# Patient Record
Sex: Female | Born: 1945 | Race: White | Hispanic: No | Marital: Married | Smoking: Never smoker
Health system: Southern US, Community
[De-identification: ages and names within clinical notes are randomized; demographics above are authoritative.]

## PROBLEM LIST (undated history)

## (undated) DIAGNOSIS — Z9221 Personal history of antineoplastic chemotherapy: Secondary | ICD-10-CM

## (undated) DIAGNOSIS — K5900 Constipation, unspecified: Secondary | ICD-10-CM

## (undated) DIAGNOSIS — H9319 Tinnitus, unspecified ear: Secondary | ICD-10-CM

## (undated) DIAGNOSIS — C801 Malignant (primary) neoplasm, unspecified: Secondary | ICD-10-CM

## (undated) DIAGNOSIS — H409 Unspecified glaucoma: Secondary | ICD-10-CM

## (undated) DIAGNOSIS — I700 Atherosclerosis of aorta: Secondary | ICD-10-CM

## (undated) DIAGNOSIS — N3289 Other specified disorders of bladder: Secondary | ICD-10-CM

## (undated) DIAGNOSIS — K219 Gastro-esophageal reflux disease without esophagitis: Secondary | ICD-10-CM

## (undated) DIAGNOSIS — R519 Headache, unspecified: Secondary | ICD-10-CM

## (undated) DIAGNOSIS — G629 Polyneuropathy, unspecified: Secondary | ICD-10-CM

## (undated) DIAGNOSIS — R030 Elevated blood-pressure reading, without diagnosis of hypertension: Secondary | ICD-10-CM

## (undated) DIAGNOSIS — G8929 Other chronic pain: Secondary | ICD-10-CM

## (undated) DIAGNOSIS — H269 Unspecified cataract: Secondary | ICD-10-CM

## (undated) HISTORY — DX: Gastro-esophageal reflux disease without esophagitis: K21.9

## (undated) HISTORY — DX: Tinnitus, unspecified ear: H93.19

## (undated) HISTORY — DX: Unspecified glaucoma: H40.9

## (undated) HISTORY — DX: Other chronic pain: G89.29

## (undated) HISTORY — PX: OTHER SURGICAL HISTORY: SHX169

## (undated) HISTORY — DX: Polyneuropathy, unspecified: G62.9

## (undated) HISTORY — DX: Headache, unspecified: R51.9

## (undated) HISTORY — DX: Unspecified cataract: H26.9

## (undated) HISTORY — PX: COLON SURGERY: SHX602

---

## 2006-08-31 ENCOUNTER — Ambulatory Visit: Payer: Self-pay | Admitting: Internal Medicine

## 2008-11-23 ENCOUNTER — Ambulatory Visit: Payer: Self-pay | Admitting: Internal Medicine

## 2011-01-22 ENCOUNTER — Ambulatory Visit: Payer: Self-pay | Admitting: Internal Medicine

## 2012-11-24 ENCOUNTER — Ambulatory Visit: Payer: Self-pay | Admitting: Internal Medicine

## 2012-12-01 ENCOUNTER — Ambulatory Visit: Payer: Self-pay | Admitting: Internal Medicine

## 2014-08-08 ENCOUNTER — Other Ambulatory Visit: Payer: Self-pay | Admitting: Internal Medicine

## 2014-08-08 ENCOUNTER — Ambulatory Visit
Admission: RE | Admit: 2014-08-08 | Discharge: 2014-08-08 | Disposition: A | Discharge status: Home / Self Care | Payer: Medicare Other | Source: Ambulatory Visit | Attending: Internal Medicine | Admitting: Internal Medicine

## 2014-08-08 DIAGNOSIS — R112 Nausea with vomiting, unspecified: Secondary | ICD-10-CM | POA: Insufficient documentation

## 2014-08-08 DIAGNOSIS — R109 Unspecified abdominal pain: Secondary | ICD-10-CM | POA: Diagnosis present

## 2014-08-08 DIAGNOSIS — R102 Pelvic and perineal pain: Secondary | ICD-10-CM

## 2014-08-09 ENCOUNTER — Ambulatory Visit
Admission: RE | Admit: 2014-08-09 | Discharge: 2014-08-09 | Disposition: A | Discharge status: Home / Self Care | Payer: Medicare Other | Source: Ambulatory Visit | Attending: Internal Medicine | Admitting: Internal Medicine

## 2014-08-09 DIAGNOSIS — R102 Pelvic and perineal pain: Secondary | ICD-10-CM | POA: Insufficient documentation

## 2014-08-15 DIAGNOSIS — C189 Malignant neoplasm of colon, unspecified: Secondary | ICD-10-CM

## 2014-08-15 HISTORY — DX: Malignant neoplasm of colon, unspecified: C18.9

## 2014-08-21 ENCOUNTER — Ambulatory Visit (INDEPENDENT_AMBULATORY_CARE_PROVIDER_SITE_OTHER): Payer: Medicare Other | Admitting: Gastroenterology

## 2014-08-21 ENCOUNTER — Encounter: Payer: Self-pay | Admitting: Gastroenterology

## 2014-08-21 VITALS — BP 158/69 | HR 94 | Temp 98.1°F | Ht 62.0 in | Wt 118.0 lb

## 2014-08-21 DIAGNOSIS — K921 Melena: Secondary | ICD-10-CM | POA: Insufficient documentation

## 2014-08-21 DIAGNOSIS — K5900 Constipation, unspecified: Secondary | ICD-10-CM | POA: Insufficient documentation

## 2014-08-21 DIAGNOSIS — R112 Nausea with vomiting, unspecified: Secondary | ICD-10-CM | POA: Insufficient documentation

## 2014-08-21 DIAGNOSIS — R634 Abnormal weight loss: Secondary | ICD-10-CM | POA: Insufficient documentation

## 2014-08-21 DIAGNOSIS — R194 Change in bowel habit: Secondary | ICD-10-CM | POA: Insufficient documentation

## 2014-08-21 NOTE — Progress Notes (Signed)
Gastroenterology Consultation  Referring Provider:     Albina Billet, MD Primary Care Physician:  Albina Billet, MD Primary Gastroenterologist:  Dr. Allen Norris     Reason for Consultation:    Change in bowel habits        HPI:   Stacey Miranda is a 69 y.o. y/o female referred for consultation & management of diarrhea by Dr. Albina Billet, MD.  As patient comes to me for a history of diarrhea with weight loss the last 2 weeks. The patient reports that she has been taking waxes because she has chronic constipation. The patient also reports that she has had chronic constipation her whole life until 2 weeks ago. The patient also reports that she has a sister with colon polyps and she had a colonoscopy in 2006 and was recommended to have another colonoscopy in 5 years but did not have one. The patient now reports that she took laxity and on 2 episodes had nausea and vomiting associated with the lactulose. She also states that she has had blood in her stools. She had reports that the 20 pound weight loss was not intentional and she states that she has not been eating recently. The patient also reports that she drinks milk on a regular basis. The patient also states that she is an avid eater of fruits and vegetables because of her constipation.  History reviewed. No pertinent past medical history.  History reviewed. No pertinent past surgical history.  Prior to Admission medications   Not on File    Family History  Problem Relation Age of Onset  . Hodgkin's lymphoma Mother   . COPD Father   . Fibroids Sister      History  Substance Use Topics  . Smoking status: Never Smoker   . Smokeless tobacco: Not on file  . Alcohol Use: No    Allergies as of 08/21/2014  . (No Known Allergies)    Review of Systems:    All systems reviewed and negative except where noted in HPI.   Physical Exam:  BP 158/69 mmHg  Pulse 94  Temp(Src) 98.1 F (36.7 C) (Oral)  Ht 5\' 2"  (1.575 m)  Wt 118 lb (53.524 kg)   BMI 21.58 kg/m2 No LMP recorded. Patient is postmenopausal. Psych:  Alert and cooperative. Normal mood and affect. General:   Alert,  Well-developed, well-nourished, pleasant and cooperative in NAD Head:  Normocephalic and atraumatic. Eyes:  Sclera clear, no icterus.   Conjunctiva pink. Ears:  Normal auditory acuity. Nose:  No deformity, discharge, or lesions. Mouth:  No deformity or lesions,oropharynx pink & moist. Neck:  Supple; no masses or thyromegaly. Lungs:  Respirations even and unlabored.  Clear throughout to auscultation.   No wheezes, crackles, or rhonchi. No acute distress. Heart:  Regular rate and rhythm; no murmurs, clicks, rubs, or gallops. Abdomen:  Normal bowel sounds.  No bruits.  Soft, non-tender and non-distended without masses, hepatosplenomegaly or hernias noted.  No guarding or rebound tenderness.  Negative Carnett sign.   Rectal:  Deferred.  Msk:  Symmetrical without gross deformities.  Good, equal movement & strength bilaterally. Pulses:  Normal pulses noted. Extremities:  No clubbing or edema.  No cyanosis. Neurologic:  Alert and oriented x3;  grossly normal neurologically. Skin:  Intact without significant lesions or rashes.  No jaundice. Lymph Nodes:  No significant cervical adenopathy. Psych:  Alert and cooperative. Normal mood and affect.  Imaging Studies: US Transvaginal Non-ob  09/08/14   CLINICAL DATA:  Pelvic pain.  EXAM: TRANSABDOMINAL AND TRANSVAGINAL ULTRASOUND OF PELVIS  TECHNIQUE: Both transabdominal and transvaginal ultrasound examinations of the pelvis were performed. Transabdominal technique was performed for global imaging of the pelvis including uterus, ovaries, adnexal regions, and pelvic cul-de-sac. It was necessary to proceed with endovaginal exam following the transabdominal exam to visualize the endometrium and ovaries.  COMPARISON:  None  FINDINGS: Uterus  Measurements: 4.3 x 2 x 2.7 cm. No fibroids or other mass visualized.  Endometrium   Thickness: 1.8 mm. Trace amount of fluid within the endometrium. No focal abnormality visualized.  Right ovary  Measurements: 2.4 x 0.8 x 0.9 cm. Normal appearance/no adnexal mass.  Left ovary  Measurements: 2.2 x 0.7 x 0.9 cm. Normal appearance/no adnexal mass.  Other findings  Trace pelvic free fluid likely physiologic.  IMPRESSION: Normal pelvic ultrasound.  No pelvic soft tissue mass.   Electronically Signed   By: Kathreen Devoid   On: 08/09/2014 16:46   US Pelvis Complete  08/09/2014   CLINICAL DATA:  Pelvic pain.  EXAM: TRANSABDOMINAL AND TRANSVAGINAL ULTRASOUND OF PELVIS  TECHNIQUE: Both transabdominal and transvaginal ultrasound examinations of the pelvis were performed. Transabdominal technique was performed for global imaging of the pelvis including uterus, ovaries, adnexal regions, and pelvic cul-de-sac. It was necessary to proceed with endovaginal exam following the transabdominal exam to visualize the endometrium and ovaries.  COMPARISON:  None  FINDINGS: Uterus  Measurements: 4.3 x 2 x 2.7 cm. No fibroids or other mass visualized.  Endometrium  Thickness: 1.8 mm. Trace amount of fluid within the endometrium. No focal abnormality visualized.  Right ovary  Measurements: 2.4 x 0.8 x 0.9 cm. Normal appearance/no adnexal mass.  Left ovary  Measurements: 2.2 x 0.7 x 0.9 cm. Normal appearance/no adnexal mass.  Other findings  Trace pelvic free fluid likely physiologic.  IMPRESSION: Normal pelvic ultrasound.  No pelvic soft tissue mass.   Electronically Signed   By: Kathreen Devoid   On: 08/09/2014 16:46   Dg Abd Acute W/chest  08/08/2014   CLINICAL DATA:  Abdominal pain for 4 days, cramping, nausea and vomiting  EXAM: DG ABDOMEN ACUTE W/ 1V CHEST  COMPARISON:  None.  FINDINGS: A linear opacity adjacent to the left heart border at the left lung base probably represents scarring. Comparison with any prior chest x-ray would be helpful. No infiltrate or effusion is seen. Mediastinal and hilar contours are  unremarkable. The heart is within normal limits in size.  Supine and erect views the abdomen show both large and small bowel gas to be present with no obstruction. No free air is seen. There is and air-fluid level in the right colon which can be seen with enemas and or diarrhea. No opaque calculi are noted. There is soft tissue fullness in the mid right pelvis on the supine view which may be due to a prominent uterus, but a pelvic mass cannot be excluded and clinical correlation is recommended. Ultrasound or CT would be helpful if further assessment is warranted.  IMPRESSION: 1. No active lung disease. Probable linear scar at the left lung base. 2. No bowel obstruction or free air. Air-fluid level in the right colon most likely due to diarrhea or enemas. 3. Soft tissue fullness of the right pelvis. Possible enlarged uterus but correlate clinically.   Electronically Signed   By: Ivar Drape M.D.   On: 08/08/2014 09:52

## 2014-08-21 NOTE — Assessment & Plan Note (Signed)
This patient is a 69 year old woman who comes in today for a change in bowel habits. The patient's husband has been a patient of mine for some time. The patient now reports that after being constipated her whole life she is now having diarrhea for the last 2 weeks with a weight loss of 20 pounds. She was due for a colonoscopy 5 years ago but did not have it done because of a family history of colon polyps. The patient states that her colonoscopy did not show any colon polyps. The patient also has had nausea because of the laxatives was taking. The patient told to stop her magnesium and laxatives due to her diarrhea. The patient had a KUB that showed her to have fluid in the colon consistent with diarrhea. The patient also had a pelvic ultrasound that showed an enlarged uterus. The patient will be set up for a colonoscopy due to her diarrhea, and bowel habits and weight loss. The patient  has been explained the plan and agrees with it.

## 2014-08-24 ENCOUNTER — Ambulatory Visit (INDEPENDENT_AMBULATORY_CARE_PROVIDER_SITE_OTHER)
Admission: EM | Admit: 2014-08-24 | Discharge: 2014-08-24 | Disposition: A | Discharge status: Home / Self Care | Payer: Medicare Other | Source: Home / Self Care | Attending: Family Medicine | Admitting: Family Medicine

## 2014-08-24 ENCOUNTER — Encounter: Payer: Self-pay | Admitting: Emergency Medicine

## 2014-08-24 ENCOUNTER — Inpatient Hospital Stay
Admission: EM | Admit: 2014-08-24 | Discharge: 2014-08-29 | DRG: 330 | Disposition: A | Discharge status: Home / Self Care | Payer: Medicare Other | Attending: Surgery | Admitting: Surgery

## 2014-08-24 ENCOUNTER — Emergency Department: Payer: Medicare Other

## 2014-08-24 ENCOUNTER — Telehealth: Payer: Self-pay | Admitting: Gastroenterology

## 2014-08-24 DIAGNOSIS — K5900 Constipation, unspecified: Secondary | ICD-10-CM

## 2014-08-24 DIAGNOSIS — K593 Megacolon, not elsewhere classified: Secondary | ICD-10-CM | POA: Diagnosis present

## 2014-08-24 DIAGNOSIS — R140 Abdominal distension (gaseous): Secondary | ICD-10-CM

## 2014-08-24 DIAGNOSIS — Z825 Family history of asthma and other chronic lower respiratory diseases: Secondary | ICD-10-CM

## 2014-08-24 DIAGNOSIS — K6389 Other specified diseases of intestine: Secondary | ICD-10-CM | POA: Diagnosis present

## 2014-08-24 DIAGNOSIS — K56600 Partial intestinal obstruction, unspecified as to cause: Secondary | ICD-10-CM

## 2014-08-24 DIAGNOSIS — Z791 Long term (current) use of non-steroidal anti-inflammatories (NSAID): Secondary | ICD-10-CM | POA: Diagnosis not present

## 2014-08-24 DIAGNOSIS — Z807 Family history of other malignant neoplasms of lymphoid, hematopoietic and related tissues: Secondary | ICD-10-CM | POA: Diagnosis not present

## 2014-08-24 DIAGNOSIS — E440 Moderate protein-calorie malnutrition: Secondary | ICD-10-CM | POA: Diagnosis not present

## 2014-08-24 DIAGNOSIS — R1900 Intra-abdominal and pelvic swelling, mass and lump, unspecified site: Secondary | ICD-10-CM | POA: Diagnosis present

## 2014-08-24 HISTORY — DX: Constipation, unspecified: K59.00

## 2014-08-24 LAB — CBC WITH DIFFERENTIAL/PLATELET
BASOS ABS: 0.3 10*3/uL — AB (ref 0–0.1)
BASOS PCT: 3 %
EOS ABS: 0.1 10*3/uL (ref 0–0.7)
EOS PCT: 1 %
HEMATOCRIT: 43.6 % (ref 35.0–47.0)
Hemoglobin: 14.3 g/dL (ref 12.0–16.0)
Lymphocytes Relative: 15 %
Lymphs Abs: 1.6 10*3/uL (ref 1.0–3.6)
MCH: 30.4 pg (ref 26.0–34.0)
MCHC: 32.8 g/dL (ref 32.0–36.0)
MCV: 92.7 fL (ref 80.0–100.0)
MONOS PCT: 7 %
Monocytes Absolute: 0.7 10*3/uL (ref 0.2–0.9)
Neutro Abs: 8.2 10*3/uL — ABNORMAL HIGH (ref 1.4–6.5)
Neutrophils Relative %: 74 %
PLATELETS: 416 10*3/uL (ref 150–440)
RBC: 4.7 MIL/uL (ref 3.80–5.20)
RDW: 15.3 % — ABNORMAL HIGH (ref 11.5–14.5)
WBC: 10.9 10*3/uL (ref 3.6–11.0)

## 2014-08-24 LAB — COMPREHENSIVE METABOLIC PANEL
ALBUMIN: 5 g/dL (ref 3.5–5.0)
ALK PHOS: 62 U/L (ref 38–126)
ALT: 13 U/L — ABNORMAL LOW (ref 14–54)
ANION GAP: 15 (ref 5–15)
AST: 27 U/L (ref 15–41)
BILIRUBIN TOTAL: 0.9 mg/dL (ref 0.3–1.2)
BUN: 10 mg/dL (ref 6–20)
CO2: 24 mmol/L (ref 22–32)
Calcium: 9.7 mg/dL (ref 8.9–10.3)
Chloride: 101 mmol/L (ref 101–111)
Creatinine, Ser: 0.73 mg/dL (ref 0.44–1.00)
GFR calc Af Amer: >60 mL/min (ref >60)
GFR calc non Af Amer: >60 mL/min (ref >60)
Glucose, Bld: 120 mg/dL — ABNORMAL HIGH (ref 65–99)
POTASSIUM: 3.1 mmol/L — AB (ref 3.5–5.1)
SODIUM: 140 mmol/L (ref 135–145)
TOTAL PROTEIN: 7.9 g/dL (ref 6.5–8.1)

## 2014-08-24 LAB — LIPASE, BLOOD: LIPASE: 35 U/L (ref 22–51)

## 2014-08-24 MED ORDER — MORPHINE SULFATE 2 MG/ML IJ SOLN
2.0000 mg | INTRAMUSCULAR | Status: DC | PRN
  Administered 2014-08-25 (×2): 2 mg via INTRAVENOUS
  Filled 2014-08-24 (×2): qty 1

## 2014-08-24 MED ORDER — MORPHINE SULFATE 4 MG/ML IJ SOLN
4.0000 mg | Freq: Once | INTRAMUSCULAR | Status: AC
  Administered 2014-08-24: 4 mg via INTRAVENOUS

## 2014-08-24 MED ORDER — POTASSIUM CHLORIDE 10 MEQ/100ML IV SOLN
10.0000 meq | INTRAVENOUS | Status: AC
  Administered 2014-08-25 (×2): 10 meq via INTRAVENOUS
  Filled 2014-08-24 (×2): qty 100

## 2014-08-24 MED ORDER — IOHEXOL 240 MG/ML SOLN
50.0000 mL | Freq: Once | INTRAMUSCULAR | Status: AC | PRN
  Administered 2014-08-24: 50 mL via ORAL

## 2014-08-24 MED ORDER — ONDANSETRON HCL 4 MG/2ML IJ SOLN
4.0000 mg | INTRAMUSCULAR | Status: AC
  Administered 2014-08-24: 4 mg via INTRAVENOUS

## 2014-08-24 MED ORDER — ONDANSETRON HCL 4 MG/2ML IJ SOLN
INTRAMUSCULAR | Status: AC
Start: 2014-08-24 — End: 2014-08-25
  Filled 2014-08-24: qty 2

## 2014-08-24 MED ORDER — PANTOPRAZOLE SODIUM 40 MG IV SOLR
INTRAVENOUS | Status: AC
  Administered 2014-08-24: 40 mg via INTRAVENOUS
  Filled 2014-08-24: qty 40

## 2014-08-24 MED ORDER — PANTOPRAZOLE SODIUM 40 MG IV SOLR
40.0000 mg | Freq: Every day | INTRAVENOUS | Status: DC
  Administered 2014-08-24 – 2014-08-28 (×5): 40 mg via INTRAVENOUS
  Filled 2014-08-24 (×4): qty 40

## 2014-08-24 MED ORDER — HEPARIN SODIUM (PORCINE) 5000 UNIT/ML IJ SOLN
5000.0000 [IU] | Freq: Three times a day (TID) | INTRAMUSCULAR | Status: DC
  Administered 2014-08-24 – 2014-08-29 (×13): 5000 [IU] via SUBCUTANEOUS
  Filled 2014-08-24 (×12): qty 1

## 2014-08-24 MED ORDER — HEPARIN SODIUM (PORCINE) 5000 UNIT/ML IJ SOLN
INTRAMUSCULAR | Status: AC
  Administered 2014-08-24: 5000 [IU] via SUBCUTANEOUS
  Filled 2014-08-24: qty 1

## 2014-08-24 MED ORDER — IOHEXOL 300 MG/ML  SOLN
100.0000 mL | Freq: Once | INTRAMUSCULAR | Status: AC | PRN
  Administered 2014-08-24: 100 mL via INTRAVENOUS

## 2014-08-24 MED ORDER — MORPHINE SULFATE 2 MG/ML IJ SOLN
INTRAMUSCULAR | Status: AC
  Administered 2014-08-24: 4 mg via INTRAVENOUS
  Filled 2014-08-24: qty 2

## 2014-08-24 MED ORDER — ONDANSETRON HCL 4 MG/2ML IJ SOLN: 4.0000 mg | Freq: Four times a day (QID) | INTRAMUSCULAR | Status: DC | PRN

## 2014-08-24 NOTE — Telephone Encounter (Signed)
Call back constipation

## 2014-08-24 NOTE — ED Provider Notes (Signed)
CSN: [id number]     Arrival date & time 08/24/14  1421 History   First MD Initiated Contact with Patient 08/24/14 1517     Chief Complaint  Patient presents with  . Constipation   (Consider location/radiation/quality/duration/timing/severity/associated sxs/prior Treatment) HPI Comments: 69 yo female with a  3 weeks h/o constipation, seen by PCP(several weeks ago; abdominal x-rays negative for obstruction, although air fluid level seen) and GI (this week) for this. Treatments have not worked. Patient complains of abdominal bloating, discomfort, nausea, fatigue. Denies fevers, chills. States was scheduled for a colonoscopy next week but states has been feeling worse.   Patient is a 69 y.o. female presenting with constipation. The history is provided by the patient.  Constipation Severity:  Severe Time since last bowel movement:  3 weeks Timing:  Constant Progression:  Worsening Chronicity:  New Stool description:  None produced Associated symptoms: nausea and vomiting     Past Medical History  Diagnosis Date  . Constipation    History reviewed. No pertinent past surgical history. Family History  Problem Relation Age of Onset  . Hodgkin's lymphoma Mother   . COPD Father   . Fibroids Sister    History  Substance Use Topics  . Smoking status: Never Smoker   . Smokeless tobacco: Not on file  . Alcohol Use: No   OB History    No data available     Review of Systems  Gastrointestinal: Positive for nausea, vomiting and constipation.    Allergies  Review of patient's allergies indicates no known allergies.  Home Medications   Prior to Admission medications   Not on File   BP 148/96 mmHg  Pulse 100  Temp(Src) 98.6 F (37 C) (Oral)  Ht 5\' 2"  (1.575 m)  Wt 116 lb (52.617 kg)  BMI 21.21 kg/m2  SpO2 100% Physical Exam  Constitutional: She appears well-developed and well-nourished. No distress.  Foul smelling breath  Cardiovascular: Normal rate and normal heart  sounds.   Pulmonary/Chest: Effort normal. No respiratory distress.  Abdominal: Soft. Bowel sounds are normal. She exhibits distension. She exhibits no mass. There is tenderness (mild diffuse tenderness). There is no rebound and no guarding.  Skin: No rash noted. She is not diaphoretic.  Nursing note and vitals reviewed.   ED Course  Procedures (including critical care time) Labs Review Labs Reviewed - No data to display  Imaging Review No results found.   MDM   1. Constipation, unspecified constipation type   2. Abdominal bloating    Plan: 1. Diagnosis reviewed with patient; discussed with patient due to no improvement with treatments from PCP and GI as well as air fluid level seen in x-ray from 08/08/14, and worsening symptoms, I would recommend patient go to ED for further evaluation and management. Patient verbalizes understanding and will proceed with husband by private vehicle to ED in stable condition.   Norval Gable, MD 08/24/14 [phone]

## 2014-08-24 NOTE — ED Notes (Signed)
Sent from [facility], seen PCP and GI (Dr. Durwin Reges) on Tuesday.  She has had three weeks of constipation unrelieved by laxatives.  She took citacel as instructed and bottle of Mg Citrate at 1230, did self rectal exam, no stool felt.  No BM since Monday and it was small, chronic constipation since 8s.  States she has had 2 episodes of vomiting undigested food over the past 2 weeks.  No fever or chills.  1 episode of rectal bleeding after milk of magnesia, on 08/13/14 but it has resolved.

## 2014-08-24 NOTE — ED Provider Notes (Signed)
[facility] Emergency Department Provider Note  ____________________________________________  Time seen: Approximately 8:42 PM  I have reviewed the triage vital signs and the nursing notes.   HISTORY  Chief Complaint Constipation; Nausea; and Emesis    HPI Stacey Miranda is a 69 y.o. female with a history of chronic constipation who presents with 3 weeks of worsening constipation.  She states that she has not had a solid bowel movement for 3 weeks but has had some loose stool.  She takes milk of magnesia regularly.  She has seen her primary care doctor as well as her GI doctor (Dr. Allen Norris).  She is been feeling increasingly bloated and severe pressure in her lower abdomen.  She denies fever and chills.  She has had intermittent nausea and vomiting.  She has a colonoscopy scheduled for next week but she has been feeling worse.  Symptoms are constant but waxing and waning in severity.  Past Medical History  Diagnosis Date  . Constipation     Patient Active Problem List   Diagnosis Date Noted  . Blood in stool 08/21/2014  . Constipation 08/21/2014  . Nausea with vomiting 08/21/2014  . Change in bowel habits 08/21/2014  . Loss of weight 08/21/2014    History reviewed. No pertinent past surgical history.  No current outpatient prescriptions on file.  Allergies Review of patient's allergies indicates no known allergies.  Family History  Problem Relation Age of Onset  . Hodgkin's lymphoma Mother   . COPD Father   . Fibroids Sister     Social History History  Substance Use Topics  . Smoking status: Never Smoker   . Smokeless tobacco: Not on file  . Alcohol Use: No    Review of Systems Constitutional: No fever/chills Eyes: No visual changes. ENT: No sore throat. Cardiovascular: Denies chest pain. Respiratory: Denies shortness of breath. Gastrointestinal: Abdominal "fullness" that is severe at times..  Nausea and vomiting.  No diarrhea.  Acute on  chronic constipation Genitourinary: Negative for dysuria. Musculoskeletal: Negative for back pain. Skin: Negative for rash. Neurological: Negative for headaches, focal weakness or numbness.  10-point ROS otherwise negative.  ____________________________________________   PHYSICAL EXAM:  VITAL SIGNS: ED Triage Vitals  Enc Vitals Group     BP 08/24/14 1605 159/100 mmHg     Pulse Rate 08/24/14 1605 111     Resp 08/24/14 1605 20     Temp 08/24/14 1605 98.4 F (36.9 C)     Temp Source 08/24/14 1605 Oral     SpO2 08/24/14 1605 100 %     Weight 08/24/14 1605 116 lb (52.617 kg)     Height 08/24/14 1605 5\' 2"  (1.575 m)     Head Cir --      Peak Flow --      Pain Score 08/24/14 1607 2     Pain Loc --      Pain Edu? --      Excl. in [place]? --     Constitutional: Alert and oriented.  Appears very uncomfortable and is holding her abdomen. Eyes: Conjunctivae are normal. PERRL. EOMI. Head: Atraumatic. Nose: No congestion/rhinnorhea. Mouth/Throat: Mucous membranes are moist.  Oropharynx non-erythematous. Neck: No stridor.   Cardiovascular: Normal rate, regular rhythm. Grossly normal heart sounds.  Good peripheral circulation. Respiratory: Normal respiratory effort.  No retractions. Lungs CTAB. Gastrointestinal: Soft, mild distention most pronounced on the right side of abdomen with moderate tenderness to palpation of the right side of her abdomen without specific focality.Marland Kitchen No  abdominal bruits. No CVA tenderness. Musculoskeletal: No lower extremity tenderness nor edema.  No joint effusions. Neurologic:  Normal speech and language. No gross focal neurologic deficits are appreciated. Speech is normal. No gait instability. Skin:  Skin is warm, dry and intact. No rash noted. Psychiatric: Mood and affect are normal. Speech and behavior are normal.  ____________________________________________   LABS (all labs ordered are listed, but only abnormal results are displayed)  Labs Reviewed   CBC WITH DIFFERENTIAL/PLATELET - Abnormal; Notable for the following:    RDW 15.3 (*)    Neutro Abs 8.2 (*)    Basophils Absolute 0.3 (*)    All other components within normal limits  COMPREHENSIVE METABOLIC PANEL - Abnormal; Notable for the following:    Potassium 3.1 (*)    Glucose, Bld 120 (*)    ALT 13 (*)    All other components within normal limits  LIPASE, BLOOD  URINALYSIS COMPLETEWITH MICROSCOPIC ([facility] ONLY)   ____________________________________________  EKG  Not indicated ____________________________________________  RADIOLOGY    PRIOR STUDIES  US Transvaginal Non-ob  2014-09-08   CLINICAL DATA:  Pelvic pain.  EXAM: TRANSABDOMINAL AND TRANSVAGINAL ULTRASOUND OF PELVIS  TECHNIQUE: Both transabdominal and transvaginal ultrasound examinations of the pelvis were performed. Transabdominal technique was performed for global imaging of the pelvis including uterus, ovaries, adnexal regions, and pelvic cul-de-sac. It was necessary to proceed with endovaginal exam following the transabdominal exam to visualize the endometrium and ovaries.  COMPARISON:  None  FINDINGS: Uterus  Measurements: 4.3 x 2 x 2.7 cm. No fibroids or other mass visualized.  Endometrium  Thickness: 1.8 mm. Trace amount of fluid within the endometrium. No focal abnormality visualized.  Right ovary  Measurements: 2.4 x 0.8 x 0.9 cm. Normal appearance/no adnexal mass.  Left ovary  Measurements: 2.2 x 0.7 x 0.9 cm. Normal appearance/no adnexal mass.  Other findings  Trace pelvic free fluid likely physiologic.  IMPRESSION: Normal pelvic ultrasound.  No pelvic soft tissue mass.   Electronically Signed   By: Kathreen Devoid   On: 09/08/2014 16:46   US Pelvis Complete  09-08-2014   CLINICAL DATA:  Pelvic pain.  EXAM: TRANSABDOMINAL AND TRANSVAGINAL ULTRASOUND OF PELVIS  TECHNIQUE: Both transabdominal and transvaginal ultrasound examinations of the pelvis were performed. Transabdominal technique was performed for global  imaging of the pelvis including uterus, ovaries, adnexal regions, and pelvic cul-de-sac. It was necessary to proceed with endovaginal exam following the transabdominal exam to visualize the endometrium and ovaries.  COMPARISON:  None  FINDINGS: Uterus  Measurements: 4.3 x 2 x 2.7 cm. No fibroids or other mass visualized.  Endometrium  Thickness: 1.8 mm. Trace amount of fluid within the endometrium. No focal abnormality visualized.  Right ovary  Measurements: 2.4 x 0.8 x 0.9 cm. Normal appearance/no adnexal mass.  Left ovary  Measurements: 2.2 x 0.7 x 0.9 cm. Normal appearance/no adnexal mass.  Other findings  Trace pelvic free fluid likely physiologic.  IMPRESSION: Normal pelvic ultrasound.  No pelvic soft tissue mass.   Electronically Signed   By: Kathreen Devoid   On: 09/08/2014 16:46   +++ NEW STUDY FROM THIS VISIT +++ Ct Abdomen Pelvis W Contrast  08/24/2014   CLINICAL DATA:  Subacute onset of constipation. Vomiting. Single episode of rectal bleeding. Initial encounter.  EXAM: CT ABDOMEN AND PELVIS WITH CONTRAST  TECHNIQUE: Multidetector CT imaging of the abdomen and pelvis was performed using the standard protocol following bolus administration of intravenous contrast.  CONTRAST:  182mL OMNIPAQUE IOHEXOL 300 MG/ML  SOLN  COMPARISON:  Abdominal radiograph performed 08/08/2014, and pelvic ultrasound performed 08/09/2014  FINDINGS: The visualized lung bases are clear.  The liver and spleen are unremarkable in appearance. The gallbladder is within normal limits. The pancreas and adrenal glands are unremarkable.  The kidneys are unremarkable in appearance. There is no evidence of hydronephrosis. No renal or ureteral stones are seen. No perinephric stranding is appreciated.  No free fluid is identified. The small bowel is unremarkable in appearance. The stomach is within normal limits. No acute vascular abnormalities are seen.  There is mild distention of the cecum to 9.4 cm, and dilatation of the ascending and  transverse colon to 7.7 cm in maximal diameter. This reflects partial obstruction due to a focal colonic mass just before the splenic flexure of the colon, measuring 3.1 x 2.0 cm. 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  A few small adjacent soft tissue nodules are seen, measuring up to 6 mm in short axis, likely reflecting adjacent nodes.  A small amount of air is noted within the colon distal to the mass, but the descending and sigmoid colon are otherwise decompressed.  The cecum is situated deep within the pelvis. The appendix is noted at the right hemipelvis, also filled with fluid, without evidence for appendicitis.  The bladder is displaced and compressed anteriorly by the distended cecum, but grossly unremarkable in appearance. The uterus is grossly unremarkable, though also compressed by the distended cecum. No suspicious adnexal masses are characterized. No inguinal lymphadenopathy is seen.  No acute osseous abnormalities are identified. There is vague nonspecific heterogeneity within the visualized osseous structures, without focal bony mass.  IMPRESSION: 1. Partial obstruction at the level of the splenic flexure of the colon due to a focal colonic mass, measuring 3.1 x 2.0 cm. Distention of the cecum to 9.4 cm, and dilatation of the ascending and transverse colon to 7.7 cm. 2. Few mildly prominent adjacent lymph nodes seen, measuring up to 6 mm in short axis, concerning for regional spread of disease. 3. The cecum is situated deep within the pelvis, and thus compresses the bladder and uterus, though the pelvis is otherwise unremarkable. 4. Vague nonspecific heterogeneity within the visualized osseous structures, without a focal bony mass.  These results were called by telephone at the time of interpretation on 08/24/2014 at 9:38 pm to Dr. Hinda Kehr, who verbally acknowledged these results.   Electronically Signed   By: Garald Balding M.D.   On: 08/24/2014 21:41  +++  Dg Abd  Acute W/chest  08/08/2014   CLINICAL DATA:  Abdominal pain for 4 days, cramping, nausea and vomiting  EXAM: DG ABDOMEN ACUTE W/ 1V CHEST  COMPARISON:  None.  FINDINGS: A linear opacity adjacent to the left heart border at the left lung base probably represents scarring. Comparison with any prior chest x-ray would be helpful. No infiltrate or effusion is seen. Mediastinal and hilar contours are unremarkable. The heart is within normal limits in size.  Supine and erect views the abdomen show both large and small bowel gas to be present with no obstruction. No free air is seen. There is and air-fluid level in the right colon which can be seen with enemas and or diarrhea. No opaque calculi are noted. There is soft tissue fullness in the mid right pelvis on the supine view which may be due to a prominent uterus, but a pelvic mass cannot be excluded and clinical correlation is recommended. Ultrasound or CT would be helpful if further  assessment is warranted.  IMPRESSION: 1. No active lung disease. Probable linear scar at the left lung base. 2. No bowel obstruction or free air. Air-fluid level in the right colon most likely due to diarrhea or enemas. 3. Soft tissue fullness of the right pelvis. Possible enlarged uterus but correlate clinically.   Electronically Signed   By: Ivar Drape M.D.   On: 08/08/2014 09:52    ____________________________________________   INITIAL IMPRESSION / ASSESSMENT AND PLAN / ED COURSE  Pertinent labs & imaging results that were available during my care of the patient were reviewed by me and considered in my medical decision making (see chart for details).  I suspect the patient's occasional loose stools may be loose stool leaking around a large impaction.  It is also possible that she has an SBO or ileus more acutely.  When I saw the patient she just had what she describes as her first solid bowel movement in several weeks which made her feel a little bit better but she still  appears quite uncomfortable.  We will evaluate with a CT abdomen and pelvis with contrast to evaluate for an emergent medical condition and that may proceed with other options such as an enema or manual inspection.   (Note that documentation was delayed due to multiple ED patients requiring immediate care.)   The patient has an abdominal mass with partial obstruction.  I consult to Dr. Rexene Edison who evaluated the patient in the emergency department and will admit for further management.  ____________________________________________  FINAL CLINICAL IMPRESSION(S) / ED DIAGNOSES  Final diagnoses:  Abdominal mass  Partial bowel obstruction      NEW MEDICATIONS STARTED DURING THIS VISIT:  New Prescriptions   No medications on file     Hinda Kehr, MD 08/25/14 [phone]

## 2014-08-24 NOTE — ED Notes (Signed)
Morphine 4mg  given via 2mg  vials.

## 2014-08-24 NOTE — ED Notes (Signed)
Patient reports some relief of nausea, completed first bottle of contrast without complication. Continues to report abdominal bloating and cramping however denies need for any medication for same.

## 2014-08-24 NOTE — ED Notes (Signed)
Pt states "I have had three weeks of constipation, I have seen PCP and GI (Dr. Durwin Reges) on Tuesday, I took Citacel as instruced and today I called him and he  instructed me to take a bottle of Mg Citrate, I did that at 12:30. I did a self rectal exam and do not feel stool. No BM since Monday and that was small. I have had chronic constipation since my 40's. I am throwing up undigested food."

## 2014-08-24 NOTE — ED Notes (Signed)
MD at bedside. 

## 2014-08-24 NOTE — H&P (Signed)
CC:  Constipation, N/V, poor appetite, 6 lb weight loss  HPI: Stacey Miranda is a pleasant, relatively healthy 69 yo F who presents with approx 2 weeks of worsening constipation, nausea/vomiting, anorexia and 6 lb weight loss.  Has a history of chronic constipation which she treats with milk of magnesia.  Over past 2 weeks has only had small, loose stools despite multiple laxatives.  + some recent nausea/vomiting and poor appetite.  Had seen Dr. Allen Norris recently with these symptoms and was planning on getting a colonoscopy on 6/16.  Came to ER on advice of office staff because of worsening inability to have BM.  No fevers/chills, night sweats, shortness of breath, cough, chest pain, dysuria/hematuria.  Active Ambulatory Problems    Diagnosis Date Noted  . Blood in stool 08/21/2014  . Constipation 08/21/2014  . Nausea with vomiting 08/21/2014  . Change in bowel habits 08/21/2014  . Loss of weight 08/21/2014   Resolved Ambulatory Problems    Diagnosis Date Noted  . No Resolved Ambulatory Problems   No Additional Past Medical History   History   Social History  . Marital Status: Married    Spouse Name: N/A  . Number of Children: N/A  . Years of Education: N/A   Occupational History  . Not on file.   Social History Main Topics  . Smoking status: Never Smoker   . Smokeless tobacco: Not on file  . Alcohol Use: No  . Drug Use: No  . Sexual Activity: Not on file   Other Topics Concern  . Not on file   Social History Narrative   No Known Allergies   Home Meds: Tylenol prn  ROS: Full ROS obtained, Pertinent positives and negatives as above.  Blood pressure 159/100, pulse 111, temperature 98.4 F (36.9 C), temperature source Oral, resp. rate 20, height 5\' 2"  (1.575 m), weight 52.617 kg (116 lb), SpO2 100 %. GEN: NAD/A&Ox3 FACE: no obvious facial trauma, normal external nose, normal external ears EYES: no scleral icterus, no conjunctivitis HEAD: normocephalic atraumatic CV: RRR, no  MRG RESP: moving air well, lungs clear ABD: soft, nontender, moderate distention EXT: moving all ext well, strength 5/5 NEURO: cnII-XII grossly intact, sensation intact all 4 ext  Labs: personally reviewed, significant for: K 3.1 WBC 10.9  CT Scan:  Personally reviewed Dilated Proximal colon with fluids with mass at splenic flexure  A/P 69 yo F admit with large bowel obstruction, concern for neoplasm.  Less likely ischemic stricture.  Will admit for IVF, GI consult for possible colonoscopy but likely will require surgery for obstructing mass.  Will leave final timing for daytime surgeon Dr. Leanora Cover.  I have discussed this with Stacey Miranda and she agrees to this plan.

## 2014-08-24 NOTE — ED Notes (Signed)
Patient transported to CT 

## 2014-08-25 ENCOUNTER — Inpatient Hospital Stay: Payer: Medicare Other | Admitting: Anesthesiology

## 2014-08-25 ENCOUNTER — Encounter
Admission: EM | Disposition: A | Discharge status: Home / Self Care | Payer: Self-pay | Source: Home / Self Care | Attending: Surgery

## 2014-08-25 ENCOUNTER — Encounter: Payer: Self-pay | Admitting: Anesthesiology

## 2014-08-25 DIAGNOSIS — E440 Moderate protein-calorie malnutrition: Secondary | ICD-10-CM | POA: Insufficient documentation

## 2014-08-25 DIAGNOSIS — K593 Megacolon, not elsewhere classified: Secondary | ICD-10-CM

## 2014-08-25 DIAGNOSIS — K5660 Unspecified intestinal obstruction: Secondary | ICD-10-CM

## 2014-08-25 HISTORY — PX: COLOSTOMY: SHX63

## 2014-08-25 LAB — CBC
HCT: 40.4 % (ref 35.0–47.0)
Hemoglobin: 13.1 g/dL (ref 12.0–16.0)
MCH: 29.7 pg (ref 26.0–34.0)
MCHC: 32.4 g/dL (ref 32.0–36.0)
MCV: 91.7 fL (ref 80.0–100.0)
Platelets: 396 10*3/uL (ref 150–440)
RBC: 4.4 MIL/uL (ref 3.80–5.20)
RDW: 15.3 % — ABNORMAL HIGH (ref 11.5–14.5)
WBC: 10.6 10*3/uL (ref 3.6–11.0)

## 2014-08-25 LAB — BASIC METABOLIC PANEL
Anion gap: 13 (ref 5–15)
BUN: 11 mg/dL (ref 6–20)
CHLORIDE: 101 mmol/L (ref 101–111)
CO2: 26 mmol/L (ref 22–32)
Calcium: 8.8 mg/dL — ABNORMAL LOW (ref 8.9–10.3)
Creatinine, Ser: 0.64 mg/dL (ref 0.44–1.00)
GFR calc Af Amer: >60 mL/min (ref >60)
GFR calc non Af Amer: >60 mL/min (ref >60)
Glucose, Bld: 116 mg/dL — ABNORMAL HIGH (ref 65–99)
Potassium: 3.3 mmol/L — ABNORMAL LOW (ref 3.5–5.1)
Sodium: 140 mmol/L (ref 135–145)

## 2014-08-25 SURGERY — CREATION, COLOSTOMY
Anesthesia: General | Site: Abdomen | Wound class: Clean Contaminated

## 2014-08-25 MED ORDER — KCL IN DEXTROSE-NACL 20-5-0.45 MEQ/L-%-% IV SOLN
INTRAVENOUS | Status: DC
  Administered 2014-08-25 – 2014-08-26 (×2): via INTRAVENOUS
  Administered 2014-08-26: 1000 mL via INTRAVENOUS
  Administered 2014-08-26 – 2014-08-28 (×5): via INTRAVENOUS
  Filled 2014-08-25 (×12): qty 1000

## 2014-08-25 MED ORDER — SODIUM CHLORIDE 0.9 % IV SOLN
1.5000 g | Freq: Four times a day (QID) | INTRAVENOUS | Status: DC
  Administered 2014-08-25: 1.5 g via INTRAVENOUS
  Filled 2014-08-25 (×4): qty 1.5

## 2014-08-25 MED ORDER — MIDAZOLAM HCL 2 MG/2ML IJ SOLN
INTRAMUSCULAR | Status: DC | PRN
  Administered 2014-08-25: 2 mg via INTRAVENOUS

## 2014-08-25 MED ORDER — ROCURONIUM BROMIDE 100 MG/10ML IV SOLN
INTRAVENOUS | Status: DC | PRN
  Administered 2014-08-25: 30 mg via INTRAVENOUS

## 2014-08-25 MED ORDER — FENTANYL CITRATE (PF) 100 MCG/2ML IJ SOLN
INTRAMUSCULAR | Status: DC | PRN
  Administered 2014-08-25 (×5): 50 ug via INTRAVENOUS

## 2014-08-25 MED ORDER — ONDANSETRON HCL 4 MG/2ML IJ SOLN: 4.0000 mg | Freq: Once | INTRAMUSCULAR | Status: DC | PRN

## 2014-08-25 MED ORDER — FENTANYL CITRATE (PF) 100 MCG/2ML IJ SOLN
INTRAMUSCULAR | Status: AC
  Filled 2014-08-25: qty 2

## 2014-08-25 MED ORDER — MORPHINE SULFATE 2 MG/ML IJ SOLN
2.0000 mg | INTRAMUSCULAR | Status: DC | PRN
  Administered 2014-08-25 (×3): 2 mg via INTRAVENOUS
  Administered 2014-08-26: 4 mg via INTRAVENOUS
  Administered 2014-08-26 (×2): 2 mg via INTRAVENOUS
  Administered 2014-08-27 (×2): 4 mg via INTRAVENOUS
  Administered 2014-08-28 (×2): 2 mg via INTRAVENOUS
  Filled 2014-08-25: qty 2
  Filled 2014-08-25 (×2): qty 1
  Filled 2014-08-25: qty 2
  Filled 2014-08-25 (×3): qty 1
  Filled 2014-08-25 (×2): qty 2
  Filled 2014-08-25: qty 1

## 2014-08-25 MED ORDER — PNEUMOCOCCAL VAC POLYVALENT 25 MCG/0.5ML IJ INJ: 0.5000 mL | INJECTION | INTRAMUSCULAR | Status: DC

## 2014-08-25 MED ORDER — FENTANYL CITRATE (PF) 100 MCG/2ML IJ SOLN
25.0000 ug | INTRAMUSCULAR | Status: DC | PRN
  Administered 2014-08-25 (×4): 25 ug via INTRAVENOUS

## 2014-08-25 MED ORDER — PROPOFOL 10 MG/ML IV BOLUS
INTRAVENOUS | Status: DC | PRN
  Administered 2014-08-25: 100 mg via INTRAVENOUS

## 2014-08-25 MED ORDER — SODIUM CHLORIDE 0.9 % IV BOLUS (SEPSIS)
1000.0000 mL | Freq: Once | INTRAVENOUS | Status: AC
  Administered 2014-08-25: 1000 mL via INTRAVENOUS

## 2014-08-25 MED ORDER — PNEUMOCOCCAL VAC POLYVALENT 25 MCG/0.5ML IJ INJ: 0.5000 mL | INJECTION | INTRAMUSCULAR | Status: DC | PRN

## 2014-08-25 MED ORDER — LACTATED RINGERS IV SOLN
INTRAVENOUS | Status: DC | PRN
  Administered 2014-08-25 (×2): via INTRAVENOUS

## 2014-08-25 MED ORDER — ONDANSETRON HCL 4 MG/2ML IJ SOLN
INTRAMUSCULAR | Status: DC | PRN
  Administered 2014-08-25: 4 mg via INTRAVENOUS

## 2014-08-25 MED ORDER — DEXAMETHASONE SODIUM PHOSPHATE 4 MG/ML IJ SOLN
INTRAMUSCULAR | Status: DC | PRN
  Administered 2014-08-25: 5 mg via INTRAVENOUS

## 2014-08-25 MED ORDER — PROMETHAZINE HCL 25 MG/ML IJ SOLN: 12.5000 mg | INTRAMUSCULAR | Status: DC | PRN

## 2014-08-25 MED ORDER — PROMETHAZINE HCL 25 MG PO TABS: 12.5000 mg | ORAL_TABLET | ORAL | Status: DC | PRN

## 2014-08-25 MED ORDER — SODIUM CHLORIDE 0.9 % IV SOLN
3.0000 g | INTRAVENOUS | Status: AC
  Administered 2014-08-25: 3 g via INTRAVENOUS
  Filled 2014-08-25: qty 3

## 2014-08-25 MED ORDER — SUCCINYLCHOLINE CHLORIDE 20 MG/ML IJ SOLN
INTRAMUSCULAR | Status: DC | PRN
  Administered 2014-08-25: 80 mg via INTRAVENOUS

## 2014-08-25 MED ORDER — LIDOCAINE HCL (CARDIAC) 20 MG/ML IV SOLN
INTRAVENOUS | Status: DC | PRN
  Administered 2014-08-25: 50 mg via INTRAVENOUS

## 2014-08-25 MED ORDER — PHENOL 1.4 % MT LIQD
1.0000 | OROMUCOSAL | Status: DC | PRN
  Filled 2014-08-25: qty 177

## 2014-08-25 SURGICAL SUPPLY — 32 items
CANISTER SUCT 1200ML W/VALVE (MISCELLANEOUS) ×3 IMPLANT
CATH TRAY 16F METER LATEX (MISCELLANEOUS) ×3 IMPLANT
CHLORAPREP W/TINT 26ML (MISCELLANEOUS) ×3 IMPLANT
CLOSURE WOUND 1/2 X4 (GAUZE/BANDAGES/DRESSINGS) ×1
DRAPE INCISE IOBAN 66X45 STRL (DRAPES) ×3 IMPLANT
DRAPE LAPAROTOMY 100X77 ABD (DRAPES) ×3 IMPLANT
DRSG OPSITE POSTOP 4X10 (GAUZE/BANDAGES/DRESSINGS) ×3 IMPLANT
ELECT CAUTERY BLADE 6.4 (BLADE) ×3 IMPLANT
ELECT CAUTERY NEEDLE TIP 1.0 (MISCELLANEOUS) ×3
ELECTRODE CAUTERY NEDL TIP 1.0 (MISCELLANEOUS) ×1 IMPLANT
GLOVE BIO SURGEON STRL SZ7.5 (GLOVE) ×3 IMPLANT
GLOVE INDICATOR 8.0 STRL GRN (GLOVE) ×3 IMPLANT
GOWN STRL REUS W/ TWL LRG LVL3 (GOWN DISPOSABLE) ×1 IMPLANT
GOWN STRL REUS W/TWL LRG LVL3 (GOWN DISPOSABLE) ×2
KIT RM TURNOVER STRD PROC AR (KITS) ×3 IMPLANT
NS IRRIG 1000ML POUR BTL (IV SOLUTION) ×3 IMPLANT
PACK BASIN MAJOR ARMC (MISCELLANEOUS) ×3 IMPLANT
PAD GROUND ADULT SPLIT (MISCELLANEOUS) ×3 IMPLANT
POUCH DRAIN 1 3/4 SMALL GREEN (OSTOMY) ×3 IMPLANT
RELOAD PROXIMATE 75MM BLUE (ENDOMECHANICALS) ×6 IMPLANT
SCALPEL HARMONIC ACE (MISCELLANEOUS) ×3 IMPLANT
SHEARS HARMONIC ACE PLUS 36CM (ENDOMECHANICALS) ×3 IMPLANT
SPONGE LAP 18X18 5 PK (GAUZE/BANDAGES/DRESSINGS) ×3 IMPLANT
STAPLER PROXIMATE 75MM BLUE (STAPLE) ×3 IMPLANT
STRIP CLOSURE SKIN 1/2X4 (GAUZE/BANDAGES/DRESSINGS) ×2 IMPLANT
SUT MNCRL 3 0 RB1 (SUTURE) ×2 IMPLANT
SUT MONOCRYL 3 0 RB1 (SUTURE) ×4
SUT PDS AB 1 TP1 54 (SUTURE) ×3 IMPLANT
SUT PROLENE 3 0 CT 1 (SUTURE) ×3 IMPLANT
SUT SILK 3-0 (SUTURE) ×3 IMPLANT
TUBE KAMVAC SUCTION (TUBING) ×3 IMPLANT
WAFER FLANGE 1 3/4 SMALL GREEN (OSTOMY) ×3 IMPLANT

## 2014-08-25 NOTE — Op Note (Signed)
Operative Note  Preoperative Diagnosis: Obstructing splenic flexure mass  Postoperative Diagnosis: Same  Operation Performed: Transverse colostomy  Surgeon: Laverle Patter., M.D.   Assistant: None  Anesthesia: General Endotracheal  Date of Procedure: 08/25/2014   Procedure in Detail:  The risks (including the possibility of adjacent organ injury, the necessity of converting to an open procedure, and the risk of postoperative infection / abscess), potential benefits, non-surgical treatment options, and expected outcomes were reviewed with the patient. The patient concurred with the proposed plan and agreed to proceed, giving informed consent.   Prior to the induction of anesthesia, antibiotic prophylaxis was administered. The patient was placed supine on the OR table and prepped and draped in the usual sterile fashion.   An upper midline incision was made and carried down through the linea alba and the peritoneum was entered carefully. The transverse colon was huge and extended down into the pelvis. It was delivered into the wound and the omentum was carefully dissected off of the transverse colon for the entire proximal portion and midportion and left intact on the left or distal portion. The 3-0 silk seromuscular pursestring suture was placed in the mid transverse colon to the left of the middle colic vessels but significantly proximal to the mass and a sump suction device was used to evacuate the enormous amount of air and 1200 cc of liquid stool from the transverse colon. This was affected with less than 1 mL of spillage. GIA stapling device was then used to divide the transverse colon just distal to the pursestring suture after the stool remaining in the distal transverse colon was milked proximally. The transverse mesial colon to the left of the middle colic vessels was then divided with the Harmonic scalpel, the small intestine was replaced in its anatomic position with the omentum  draped over top of it and a suitable location for a transverse colostomy to the right of the incision was found, just above the umbilicus, and a cylinder of skin and subcutaneous tissue was excised and a longitudinal incision was made in the anterior rectus sheath, the rectus muscle was split in the direction of its fibers and the posterior rectus sheath was opened longitudinally as well to admit 3 of my fingers. The transverse colon was brought through this and after the peritoneum was irrigated with warm normal saline the linea alba was closed with a running #1 PDS suture the subcutaneous tissue was irrigated and the skin was closed with a skin stapling device. The colostomy was matured after removing a sliver of colon with electrocautery, with interrupted 3-0 Monocryl sutures from full thickness colon wall to full-thickness skin. Sterile dressings and an ostomy appliance were added to complete the procedure.  Findings: No obvious peritoneal disease. Massively dilated transverse colon with one area of seromuscular split which was left intact.          Specimens: Sliver of transverse colon           Complications: None; the patient tolerated the procedure well.   Consuela Mimes, MD 08/25/2014

## 2014-08-25 NOTE — Progress Notes (Signed)
Patient has significant abdominal distention both on physical examination and x-ray. I explained to her and her son (and her husband by writing) that I felt the best plan was a 2 stage procedure, where in stage I, to be performed today, would consist of a colostomy and possibly a mucous fistula to relieve the obstruction, and stage II, which could be completed in a few weeks, would consist of a definitive colectomy of the mass and colostomy takedown. She understands that I will not be performing stage II of her procedure and that my partner will take over her care on Monday morning. She knows that further evaluation, such as a total colonoscopy, will be performed in between the 2 stages and that I have ordered a CEA today. She also knows that her CT scan does not show any distant metastases, at least in the liver. She wishes to proceed with the plan.

## 2014-08-25 NOTE — Anesthesia Preprocedure Evaluation (Addendum)
Anesthesia Evaluation  Patient identified by MRN, date of birth, ID band Patient awake    Reviewed: Allergy & Precautions, NPO status , Patient's Chart, lab work & pertinent test results  Airway Mallampati: II       Dental  (+) Chipped   Pulmonary          Cardiovascular     Neuro/Psych    GI/Hepatic   Endo/Other    Renal/GU      Musculoskeletal   Abdominal   Peds  Hematology   Anesthesia Other Findings Colon cancer.  Reproductive/Obstetrics                            Anesthesia Physical Anesthesia Plan  ASA: III  Anesthesia Plan: General   Post-op Pain Management:    Induction: Intravenous and Rapid sequence  Airway Management Planned: Oral ETT  Additional Equipment:   Intra-op Plan:   Post-operative Plan:   Informed Consent: I have reviewed the patients History and Physical, chart, labs and discussed the procedure including the risks, benefits and alternatives for the proposed anesthesia with the patient or authorized representative who has indicated his/her understanding and acceptance.     Plan Discussed with: CRNA  Anesthesia Plan Comments:         Anesthesia Quick Evaluation

## 2014-08-25 NOTE — Anesthesia Procedure Notes (Signed)
Procedure Name: Intubation Date/Time: 08/25/2014 12:41 PM Performed by: Stacey Miranda Pre-anesthesia Checklist: Patient identified, Patient being monitored, Timeout performed, Emergency Drugs available and Suction available Patient Re-evaluated:Patient Re-evaluated prior to inductionOxygen Delivery Method: Circle system utilized Preoxygenation: Pre-oxygenation with 100% oxygen Intubation Type: IV induction Ventilation: Mask ventilation without difficulty Laryngoscope Size: Mac and 3 Grade View: Grade I Tube type: Oral Tube size: 7.5 mm Number of attempts: 1 Placement Confirmation: ETT inserted through vocal cords under direct vision,  positive ETCO2 and breath sounds checked- equal and bilateral Secured at: 21 cm Tube secured with: Tape Dental Injury: Teeth and Oropharynx as per pre-operative assessment

## 2014-08-25 NOTE — Progress Notes (Signed)
Pt admitted to [facility] from ED. Alert and oriented. No c/o pain. No n/v noted.  Remains NPO except ice chips. VSS. K+3.1 so pt received 2 runs of Kcl as ordered. Voiding without difficulty. Will cont. To monitor.

## 2014-08-25 NOTE — Progress Notes (Signed)
Initial Nutrition Assessment  DOCUMENTATION CODES:  Non-severe (moderate) malnutrition in context of acute illness/injury     NUTRITION DIAGNOSIS:  Inadequate oral intake related to altered GI function as evidenced by NPO status.    GOAL:  Patient will meet greater than or equal to 90% of their needs    MONITOR:   (Energy intake, Digestive system, Electrolyte and renal profile, Anthropometric)  REASON FOR ASSESSMENT:  Malnutrition Screening Tool    ASSESSMENT:  Pt admitted with 2 weeks of constipation, nausea, vomiting, anorexia. Found to have small bowel obstruction possible neoplasm.  Planning colostomy with possible muscous fistula today to relieve obstruction  PMHx:  Past Medical History  Diagnosis Date  . Constipation     Current Nutrition: NPO   Nutrition Prior to Admission Pt reports for the past 3 weeks eating sips and bites of meals secondary to nausea, vomiting, abdominal pain   Labs:  Electrolyte and Renal Profile:  Recent Labs Lab 08/24/14 1616 08/25/14 0442  BUN 10 11  CREATININE 0.73 0.64  NA 140 140  K 3.1* 3.3*    Glucose profile: glucose 116  Medications: KCL, D5 1/2 NS with KCl at 22ml/hr (72 hr only),    Physical  Findings:  Nutrition-Focused physical exam completed. Findings are WDL for fat depletion, muscle depletion, and edema.     Weight Change: 4% weight loss in the last 3 weeks per pt report   Height:  Ht Readings from Last 1 Encounters:  08/25/14 5\' 2"  (1.575 m)    Weight:  Wt Readings from Last 1 Encounters:  08/25/14 121 lb 12.8 oz (55.248 kg)     BMI:  Body mass index is 22.27 kg/(m^2).  Estimated Nutritional Needs:  Kcal:  1033 kcals (IF 1.1-1.4, AF 1.2) 1093-2355 kcals/.d   Protein:  (1.2-1.5 g/d) 66-83 g/d  Fluid:  (25-18ml/kg) 1375-1678ml/d  Skin:  Reviewed, no issues  Diet Order:  Diet NPO time specified Except for: Ice Chips  EDUCATION NEEDS:  No education needs identified at this  time   Intake/Output Summary (Last 24 hours) at 08/25/14 0945 Last data filed at 08/25/14 0528  Gross per 24 hour  Intake    172 ml  Output    300 ml  Net   -128 ml    Last BM:  6/10, noted + flatus, distended abdomen   HIGH Care Level  Taneisha Fuson B. Zenia Resides, [place], [place] (pager)

## 2014-08-25 NOTE — Anesthesia Postprocedure Evaluation (Signed)
  Anesthesia Post-op Note  Patient: Stacey Miranda  Procedure(s) Performed: Procedure(s): COLOSTOMY (N/A)  Anesthesia type:General  Patient location: PACU  Post pain: Pain level controlled  Post assessment: Post-op Vital signs reviewed, Patient's Cardiovascular Status Stable, Respiratory Function Stable, Patent Airway and No signs of Nausea or vomiting  Post vital signs: Reviewed and stable  Last Vitals:  Filed Vitals:   08/25/14 1530  BP: 139/70  Pulse: 94  Temp:   Resp: 19    Level of consciousness: awake, alert  and patient cooperative  Complications: No apparent anesthesia complications

## 2014-08-25 NOTE — Transfer of Care (Signed)
Immediate Anesthesia Transfer of Care Note  Patient: Stacey Miranda  Procedure(s) Performed: Procedure(s): COLOSTOMY (N/A)  Patient Location: PACU  Anesthesia Type:General  Level of Consciousness: Alert, Awake, Oriented  Airway & Oxygen Therapy: Patient Spontanous Breathing  Post-op Assessment: Report given to RN  Post vital signs: Reviewed and stable  Last Vitals:  Filed Vitals:   08/25/14 1439  BP: 156/78  Pulse: 97  Temp: 37.3 C  Resp: 16    Complications: No apparent anesthesia complications

## 2014-08-25 NOTE — Progress Notes (Signed)
GI Note:  Since she has already gone for surgery, no role for colonoscopy at this time.  She can f/u in Dr Allen Norris or my GI clinic regarding colonoscopy prior to ostomy takedown.  Please contact me if if I can be of additional assistance at this time.

## 2014-08-26 LAB — URINALYSIS COMPLETE WITH MICROSCOPIC (ARMC ONLY)
Bacteria, UA: NONE SEEN
Bilirubin Urine: NEGATIVE
GLUCOSE, UA: 50 mg/dL — AB
Leukocytes, UA: NEGATIVE
NITRITE: NEGATIVE
PROTEIN: NEGATIVE mg/dL
Specific Gravity, Urine: 1.013 (ref 1.005–1.030)
pH: 6 (ref 5.0–8.0)

## 2014-08-26 LAB — CEA: CEA: 1.4 ng/mL (ref 0.0–4.7)

## 2014-08-26 NOTE — Progress Notes (Signed)
1 Day Post-Op   Subjective:  Minimal (clear) nasogastric output. Good colostomy output already. Patient feels much better and is taking very little for pain.  Vital signs in last 24 hours: Temp:  [98.4 F (36.9 C)-99.1 F (37.3 C)] 98.4 F (36.9 C) (06/12 0802) Pulse Rate:  [73-98] 96 (06/12 0802) Resp:  [16-30] 16 (06/12 0802) BP: (130-156)/(65-93) 130/65 mmHg (06/12 0802) SpO2:  [92 %-100 %] 100 % (06/12 0802) Last BM Date: 08/26/14  Intake/Output from previous day: 06/11 0701 - 06/12 0700 In: 3909 [I.V.:3809; IV Piggyback:100] Out: 1975 [Urine:1625; Stool:350]  GI: soft, non-tender; bowel sounds normal; no masses,  no organomegaly  Lab Results:  CBC  Recent Labs  08/24/14 1616 08/25/14 0442  WBC 10.9 10.6  HGB 14.3 13.1  HCT 43.6 40.4  PLT 416 396   CMP     Component Value Date/Time   NA 140 08/25/2014 0442   K 3.3* 08/25/2014 0442   CL 101 08/25/2014 0442   CO2 26 08/25/2014 0442   GLUCOSE 116* 08/25/2014 0442   BUN 11 08/25/2014 0442   CREATININE 0.64 08/25/2014 0442   CALCIUM 8.8* 08/25/2014 0442   PROT 7.9 08/24/2014 1616   ALBUMIN 5.0 08/24/2014 1616   AST 27 08/24/2014 1616   ALT 13* 08/24/2014 1616   ALKPHOS 62 08/24/2014 1616   BILITOT 0.9 08/24/2014 1616   GFRNONAA >60 08/25/2014 0442   GFRAA >60 08/25/2014 0442   PT/INR No results for input(s): LABPROT, INR in the last 72 hours.  Studies/Results: Ct Abdomen Pelvis W Contrast  08/24/2014   CLINICAL DATA:  Subacute onset of constipation. Vomiting. Single episode of rectal bleeding. Initial encounter.  EXAM: CT ABDOMEN AND PELVIS WITH CONTRAST  TECHNIQUE: Multidetector CT imaging of the abdomen and pelvis was performed using the standard protocol following bolus administration of intravenous contrast.  CONTRAST:  133mL OMNIPAQUE IOHEXOL 300 MG/ML  SOLN  COMPARISON:  Abdominal radiograph performed 08/08/2014, and pelvic ultrasound performed 08/09/2014  FINDINGS: The visualized lung bases are clear.   The liver and spleen are unremarkable in appearance. The gallbladder is within normal limits. The pancreas and adrenal glands are unremarkable.  The kidneys are unremarkable in appearance. There is no evidence of hydronephrosis. No renal or ureteral stones are seen. No perinephric stranding is appreciated.  No free fluid is identified. The small bowel is unremarkable in appearance. The stomach is within normal limits. No acute vascular abnormalities are seen.  There is mild distention of the cecum to 9.4 cm, and dilatation of the ascending and transverse colon to 7.7 cm in maximal diameter. This reflects partial obstruction due to a focal colonic mass just before the splenic flexure of the colon, measuring 3.1 x 2.0 cm. 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  A few small adjacent soft tissue nodules are seen, measuring up to 6 mm in short axis, likely reflecting adjacent nodes.  A small amount of air is noted within the colon distal to the mass, but the descending and sigmoid colon are otherwise decompressed.  The cecum is situated deep within the pelvis. The appendix is noted at the right hemipelvis, also filled with fluid, without evidence for appendicitis.  The bladder is displaced and compressed anteriorly by the distended cecum, but grossly unremarkable in appearance. The uterus is grossly unremarkable, though also compressed by the distended cecum. No suspicious adnexal masses are characterized. No inguinal lymphadenopathy is seen.  No acute osseous abnormalities are identified. There is vague nonspecific heterogeneity within the  visualized osseous structures, without focal bony mass.  IMPRESSION: 1. Partial obstruction at the level of the splenic flexure of the colon due to a focal colonic mass, measuring 3.1 x 2.0 cm. Distention of the cecum to 9.4 cm, and dilatation of the ascending and transverse colon to 7.7 cm. 2. Few mildly prominent adjacent lymph nodes seen, measuring up to 6  mm in short axis, concerning for regional spread of disease. 3. The cecum is situated deep within the pelvis, and thus compresses the bladder and uterus, though the pelvis is otherwise unremarkable. 4. Vague nonspecific heterogeneity within the visualized osseous structures, without a focal bony mass.  These results were called by telephone at the time of interpretation on 08/24/2014 at 9:38 pm to Dr. Hinda Kehr, who verbally acknowledged these results.   Electronically Signed   By: Garald Balding M.D.   On: 08/24/2014 21:41    Assessment/Plan: Recovering nicely. Discontinue nasogastric tube but continue ice chips only for today.

## 2014-08-27 ENCOUNTER — Encounter: Payer: Self-pay | Admitting: Surgery

## 2014-08-27 NOTE — Progress Notes (Signed)
Per dr Burt Knack pt can be put on clear liquid diet as well as have foley removed

## 2014-08-27 NOTE — Care Management Note (Signed)
Case Management Note  Patient Details  Name: Stacey Miranda MRN: [id number] Date of Birth: 16-Feb-1946  Subjective/Objective:                    Action/Plan:   Expected Discharge Date:                  Expected Discharge Plan:     In-House Referral:     Discharge planning Services     Post Acute Care Choice:    Choice offered to:     DME Arranged:    DME Agency:     HH Arranged:    [place] Agency:     Status of Service:     Medicare Important Message Given:   yes Date Medicare IM Given:   08/27/14 Medicare IM give by:   MR Case Manager Date Additional Medicare IM Given:    Additional Medicare Important Message give by:     If discussed at [place] of Stay Meetings, dates discussed:    Additional Comments:  Amer Alcindor A, RN 08/27/2014, 8:53 AM

## 2014-08-27 NOTE — Progress Notes (Signed)
2 Days Post-Op  Subjective: Feels much better and much less distended. Ostomy is functional.  Objective: Vital signs in last 24 hours: Temp:  [98.1 F (36.7 C)-98.7 F (37.1 C)] 98.1 F (36.7 C) (06/13 0806) Pulse Rate:  [83-89] 83 (06/13 0806) Resp:  [16-18] 18 (06/13 0806) BP: (104-115)/(53-62) 115/53 mmHg (06/13 0806) SpO2:  [95 %-97 %] 97 % (06/13 0806) Last BM Date: 08/26/14  Intake/Output from previous day: 06/12 0701 - 06/13 0700 In: 2034 [I.V.:2034] Out: 2880 [Urine:2800; Emesis/NG output:30; Stool:50] Intake/Output this shift: Total I/O In: 11.1 [I.V.:11.1] Out: 0   Physical exam:  Soft nondistended abdomen right upper quadrant ostomy is functional.  Nontender calves.  Lab Results: CBC   Recent Labs  08/24/14 1616 08/25/14 0442  WBC 10.9 10.6  HGB 14.3 13.1  HCT 43.6 40.4  PLT 416 396   BMET  Recent Labs  08/24/14 1616 08/25/14 0442  NA 140 140  K 3.1* 3.3*  CL 101 101  CO2 24 26  GLUCOSE 120* 116*  BUN 10 11  CREATININE 0.73 0.64  CALCIUM 9.7 8.8*   PT/INR No results for input(s): LABPROT, INR in the last 72 hours. ABG No results for input(s): PHART, HCO3 in the last 72 hours.  Invalid input(s): PCO2, PO2  Studies/Results: No results found.  Anti-infectives: Anti-infectives    Start     Dose/Rate Route Frequency Ordered Stop   08/25/14 0915  Ampicillin-Sulbactam (UNASYN) 3 g in sodium chloride 0.9 % 100 mL IVPB     3 g 100 mL/hr over 60 Minutes Intravenous STAT 08/25/14 0837 08/25/14 1226   08/25/14 0915  ampicillin-sulbactam (UNASYN) 1.5 g in sodium chloride 0.9 % 50 mL IVPB  Status:  Discontinued     1.5 g 100 mL/hr over 30 Minutes Intravenous Every 6 hours 08/25/14 0837 08/26/14 1225      Assessment/Plan: s/p Procedure(s): COLOSTOMY   A she will require additional workup either in the form of colonoscopy. Or in the form of an exploratory laparotomy. I discussed with the patient and apparently Dr. Leanora Cover had suggested  outpatient PET scan and colonoscopy which is certainly an option. Her albumen was normal on admission but she may benefit from a short period of improvement of nutritional status prior to the second major operation. This was discussed with she and her husband. I will review records and consider either surgical intervention or workup as an outpatient.  Florene Glen, MD, FACS  08/27/2014

## 2014-08-27 NOTE — Care Management Note (Addendum)
Case Management Note  Patient Details  Name: Stacey Miranda MRN: [id number] Date of Birth: [birth year]  Subjective/Objective:         69 yo Stacey Stacey Miranda was admitted 08/24/14 and received a colostomy on 08/25/14. Currently pending a possible surgery for an abdominal mass. Requested that her nurse Abby request a consult with the Ostomy Nurse. PCP =Dr Hall Busing. Pharmacy= [facility] in [place]. Resides with her husband who can provide transportation to appointments. Stacey Miranda reports never having home health before and does not anticipate needing home health at this time. Stacey Miranda denies having any home assistive equipment. Anticipate need for a rolling walker pending PT's recommendation. Case management will follow for discharge planning.             Action/Plan:   Expected Discharge Date:                  Expected Discharge Plan:     In-House Referral:     Discharge planning Services     Post Acute Care Choice:    Choice offered to:     DME Arranged:    DME Agency:     HH Arranged:    [place] Agency:     Status of Service:     Medicare Important Message Given:    Date Medicare IM Given:    Medicare IM give by:    Date Additional Medicare IM Given:    Additional Medicare Important Message give by:     If discussed at [place] of Stay Meetings, dates discussed:    Additional Comments:  Augustine Leverette A, RN 08/27/2014, 2:44 PM

## 2014-08-27 NOTE — Clinical Documentation Improvement (Signed)
  Patient admitted with an obstructing colon mass requiring transverse colostomy.  Registered Dietician assessment completed 08/25/14 at 0952 by Jennet Maduro producing a nutritional diagnosis of (nonsevere) Moderate Malnutrition.  Please document in the progress notes and discharge summary if you agree with the nutritional diagnosis of Moderate Malnutrition.  If you disagree, please document a diagnosis that you feel is more appropriate.   Thank You, Erling Conte ,RN Clinical Documentation Specialist:  [phone] [place] Information Management

## 2014-08-28 LAB — SURGICAL PATHOLOGY

## 2014-08-28 NOTE — Progress Notes (Signed)
3 Days Post-Op  Subjective: No complaints, wants to go home  Objective: Vital signs in last 24 hours: Temp:  [98 F (36.7 C)-98.8 F (37.1 C)] 98 F (36.7 C) (06/14 0742) Pulse Rate:  [73-88] 73 (06/14 0742) Resp:  [18] 18 (06/14 0742) BP: (124-126)/(62-67) 126/67 mmHg (06/14 0742) SpO2:  [98 %] 98 % (06/14 0742) Last BM Date: 08/28/14  Intake/Output from previous day: 06/13 0701 - 06/14 0700 In: 2178.1 [I.V.:2178.1] Out: 4075 [Urine:4000; Stool:75] Intake/Output this shift: Total I/O In: 808 [P.O.:360; I.V.:448] Out: 1525 [Urine:1500; Stool:25]  Physical exam:  Soft nontender abdomen nondistended wound clean ostomy functional  Lab Results: CBC  No results for input(s): WBC, HGB, HCT, PLT in the last 72 hours. BMET No results for input(s): NA, K, CL, CO2, GLUCOSE, BUN, CREATININE, CALCIUM in the last 72 hours. PT/INR No results for input(s): LABPROT, INR in the last 72 hours. ABG No results for input(s): PHART, HCO3 in the last 72 hours.  Invalid input(s): PCO2, PO2  Studies/Results: No results found.  Anti-infectives: Anti-infectives    Start     Dose/Rate Route Frequency Ordered Stop   08/25/14 0915  Ampicillin-Sulbactam (UNASYN) 3 g in sodium chloride 0.9 % 100 mL IVPB     3 g 100 mL/hr over 60 Minutes Intravenous STAT 08/25/14 0837 08/25/14 1226   08/25/14 0915  ampicillin-sulbactam (UNASYN) 1.5 g in sodium chloride 0.9 % 50 mL IVPB  Status:  Discontinued     1.5 g 100 mL/hr over 30 Minutes Intravenous Every 6 hours 08/25/14 0837 08/26/14 1225      Assessment/Plan: s/p Procedure(s): COLOSTOMY   Patient doing very well.  I sat with the patient and discussed options and rationale for offering colon resection. I also discussed with her the timing of such that it could be done immediately or at any time in the next 2 weeks. She told me that she and her son had discussed going to do and in fact she has a colorectal surgery appointment in 10 days.  Therefore the plan would be to discharge the patient tomorrow provided she is tolerating advancing diet with instructions to improve her nutrition in anticipation of further surgery in the near future. She was in agreement with this plan and I will advance her diet tonight.Florene Glen, MD, FACS  08/28/2014

## 2014-08-29 LAB — PLATELET COUNT: Platelets: 337 10*3/uL (ref 150–440)

## 2014-08-29 MED ORDER — OXYCODONE-ACETAMINOPHEN 5-325 MG PO TABS: 1.0000 | ORAL_TABLET | ORAL | Status: DC | PRN

## 2014-08-29 NOTE — Consult Note (Signed)
WOC ostomy consult note Stoma type/location: Right mid-quadrant colostomy.  Ostomy is functional today.  To perform pouch change and teaching.  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  IS being discharged today and will admit to [facility] for further surgery of colonic mass.   Stomal assessment/size: 1 1/2" round pink and moist well budded stoma.   Peristomal assessment: Midline abdominal dressing in place.  (honeycomb).  Pouch is trimmed around this dressing for removal.  Skin intact peristomally.  Treatment options for stomal/peristomal skin: NOne Output Liquid brown stool.  Ostomy pouching: 2pc. 1 3/4" system used to acomodate  Midline dressing.   Education provided: Discussed peristomal skin cleansing, showering, frequency of pouch change and emptying pouch.  Discussed pouch filter for managing flatus and odor.  Patient participated in self care.  3 additional pouches sent home with patient.   Enrolled patient in [place] program: Yes Will not follow at this time.  Please re-consult if needed.  Domenic Moras RN BSN [place] Pager [phone]

## 2014-08-29 NOTE — Care Management Note (Signed)
Case Management Note  Patient Details  Name: CHIFFON KITTLESON MRN: [id number] Date of Birth: 1945/05/28  Subjective/Objective:    Verified with patient that she still is declining home health services. 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 Wound care nurse in and states she will be ordering home ostomy supplies for patient.                Action/Plan:   Expected Discharge Date:    08/29/2014              Expected Discharge Plan:  Home/Self Care  In-House Referral:     Discharge planning Services     Post Acute Care Choice:    Choice offered to:     DME Arranged:    DME Agency:     HH Arranged:  Patient Refused HH Agency:     Status of Service:  Completed, signed off  Medicare Important Message Given:  Yes Date Medicare IM Given:  08/29/14 Medicare IM give by:  Orvan July Date Additional Medicare IM Given:    Additional Medicare Important Message give by:     If discussed at [place] of Stay Meetings, dates discussed:    Additional Comments:  Jolly Mango, RN 08/29/2014, 10:14 AM

## 2014-08-29 NOTE — Telephone Encounter (Signed)
Pt returned call confirming she was in the hospital. She was cancelling her colonoscopy.

## 2014-08-29 NOTE — Discharge Summary (Signed)
Physician Discharge Summary  Patient ID: Stacey Miranda MRN: [id number] DOB/AGE: 1946/01/31 69 y.o.  Admit date: 08/24/2014 Discharge date: 08/29/2014   Discharge Diagnoses:  Active Problems:   Colonic mass   Malnutrition of moderate degree   Discharged Condition: stable  Procedures: Transverse colostomy  Hospital Course: Patient was admitted to the hospital with signs of large bowel obstruction secondary to an obstructing splenic flexure mass. Dr. Leanora Cover took the patient to the operating room for colostomy and decompression. She is currently tolerating a regular diet and is discharged in stable condition with instructions for showering and using oral analgesia 6. Follow-up in our office in 10 days for reevaluation of her wound etc. Her family and she had discussed seeing the [facility] colorectal surgery service for further definitive care and has an appointment with them next week. He is encouraged to keep that appointment for definitive care of this obstructing mass.  Consults: GI  Significant Diagnostic Studies: CT scan   Disposition: 01-Home or Self Care  Discharge Instructions    DME Ostomy supplies    Complete by:  As directed             Medication List    TAKE these medications        oxyCODONE-acetaminophen 5-325 MG per tablet  Commonly known as:  ROXICET  Take 1 tablet by mouth every 4 (four) hours as needed for moderate pain.           Follow-up Information    Follow up with [facility] colorectal surgery. Go in 10 days.   Why:  for planning of further surgery      Florene Glen, MD, FACS

## 2014-08-29 NOTE — Progress Notes (Signed)
Pt A&O. Discharged to home, concerns addressed. Wound care consult complete. Pt IV site removed. Prescriptions given. Supplies given for colostomy.

## 2014-08-29 NOTE — Telephone Encounter (Signed)
LVM for pt to return my call. Colonoscopy scheduled for 08-30-14. Was informed pt was in the hospital. Needs to confirm pt is cancelling procedure.

## 2014-08-29 NOTE — Discharge Instructions (Signed)
May shower. Remove midline dressing in the next 24-48 hours. Follow-up with [facility] surgical in one week for wound check. Keep appointment with [facility] colorectal surgery service that was made by family. Resume all home medications. Oral analgesics provided.

## 2014-08-29 NOTE — Progress Notes (Signed)
Patient is hemodynamically stable this shift,medicated for abdominal pain x1,colostomy working,ambulating,performing ostomy emptying with minimal assistance.

## 2014-08-29 NOTE — Progress Notes (Signed)
4 Days Post-Op  Subjective: Postop colostomy for obstructing mass in the splenic flexure. No problems at this time feels well wants to be discharged today.  Objective: Vital signs in last 24 hours: Temp:  [97.9 F (36.6 C)-98.4 F (36.9 C)] 97.9 F (36.6 C) (06/15 0753) Pulse Rate:  [78-83] 78 (06/15 0753) Resp:  [16-18] 16 (06/15 0753) BP: (109-129)/(46-60) 127/53 mmHg (06/15 0753) SpO2:  [98 %-99 %] 98 % (06/15 0753) Last BM Date: 08/28/14  Intake/Output from previous day: 06/14 0701 - 06/15 0700 In: 1359 [P.O.:540; I.V.:819] Out: 3125 [Urine:2750; Stool:375] Intake/Output this shift:    Physical exam:  Wound is clean soft nontender abdomen ostomy functional.  Lab Results: CBC   Recent Labs  08/29/14 0641  PLT 337   BMET No results for input(s): NA, K, CL, CO2, GLUCOSE, BUN, CREATININE, CALCIUM in the last 72 hours. PT/INR No results for input(s): LABPROT, INR in the last 72 hours. ABG No results for input(s): PHART, HCO3 in the last 72 hours.  Invalid input(s): PCO2, PO2  Studies/Results: No results found.  Anti-infectives: Anti-infectives    Start     Dose/Rate Route Frequency Ordered Stop   08/25/14 0915  Ampicillin-Sulbactam (UNASYN) 3 g in sodium chloride 0.9 % 100 mL IVPB     3 g 100 mL/hr over 60 Minutes Intravenous STAT 08/25/14 0837 08/25/14 1226   08/25/14 0915  ampicillin-sulbactam (UNASYN) 1.5 g in sodium chloride 0.9 % 50 mL IVPB  Status:  Discontinued     1.5 g 100 mL/hr over 30 Minutes Intravenous Every 6 hours 08/25/14 0837 08/26/14 1225      Assessment/Plan: s/p Procedure(s): COLOSTOMY   Patient had not needed any home health and she is quite educated. However she has not had any postop colostomy care teaching by ostomy nurse nor has arrangements for appliances etc. at home have been made. I will we will advance diet today and try to arrange for discharge later today. This was discussed with the RN caring for the patient today to  expedite wound care and ostomy care teaching.Florene Glen, MD, FACS  08/29/2014

## 2014-08-30 ENCOUNTER — Ambulatory Visit: Admission: RE | Admit: 2014-08-30 | Payer: Medicare Other | Source: Ambulatory Visit | Admitting: Gastroenterology

## 2014-08-30 ENCOUNTER — Encounter: Admission: RE | Payer: Self-pay | Source: Ambulatory Visit

## 2014-08-30 SURGERY — COLONOSCOPY
Anesthesia: Choice

## 2014-09-03 ENCOUNTER — Telehealth: Payer: Self-pay | Admitting: Surgery

## 2014-09-03 NOTE — Telephone Encounter (Signed)
Explained that staples cannot be taken out early for fear of incision opening.  Patient states that she is having a Colonoscopy on the 22nd at [facility] and is concerned that she may not be able to make appt on the 23rd to have staples removed.   Explained that I could move her to the 24th if needed due to this conflict but that I need her to call me the  Morning of the 23rd if she cannot make appt.   Pt verbalizes understanding.

## 2014-09-03 NOTE — Telephone Encounter (Signed)
Please call patient - she has a post op appt with Dr Rexene Edison on the 23rd to get her staples removed - she would like to get them removed before then if possible because she is also being treated at [facility] and has to have a colonoscopy. Please call and advise. Thank you.

## 2014-09-06 ENCOUNTER — Ambulatory Visit (INDEPENDENT_AMBULATORY_CARE_PROVIDER_SITE_OTHER): Payer: Medicare Other | Admitting: Surgery

## 2014-09-06 ENCOUNTER — Encounter: Payer: Self-pay | Admitting: Surgery

## 2014-09-06 VITALS — BP 121/79 | HR 79 | Temp 98.2°F | Ht 62.0 in | Wt 112.4 lb

## 2014-09-06 DIAGNOSIS — C189 Malignant neoplasm of colon, unspecified: Secondary | ICD-10-CM

## 2014-09-06 NOTE — Patient Instructions (Signed)
You may begin driving short distances now, but try to avoid long trips if possible.  Follow-up with [facility] as planned.  Please call our office with any further questions or concerns.

## 2014-09-06 NOTE — Progress Notes (Signed)
Surgery Progress Note  S: Min pain.  Undergoing therapy and workup at [facility].  Tolerating diet. Ostomy working well.  Blood pressure 121/79, pulse 79, temperature 98.2 F (36.8 C), temperature source Oral, height 5\' 2"  (1.575 m), weight 112 lb 6.4 oz (50.984 kg). ABD: soft, min tender, nondistended, ostomy pink, incision c/d/i  A/P 69 yo F with obstructing mass, probable neoplasm.  Undergoing workup.  Told to call or return to clinic if any issues.

## 2014-09-06 NOTE — Progress Notes (Deleted)
Subjective:     Patient ID: Stacey Miranda, female   DOB: 1945-04-10, 69 y.o.   MRN: [id number]  HPI   Review of Systems     Objective:   Physical Exam     Assessment:     ***    Plan:     ***

## 2014-09-18 DIAGNOSIS — C185 Malignant neoplasm of splenic flexure: Secondary | ICD-10-CM | POA: Insufficient documentation

## 2014-11-01 DIAGNOSIS — Z8489 Family history of other specified conditions: Secondary | ICD-10-CM | POA: Diagnosis not present

## 2014-11-01 DIAGNOSIS — Z807 Family history of other malignant neoplasms of lymphoid, hematopoietic and related tissues: Secondary | ICD-10-CM | POA: Diagnosis not present

## 2014-11-01 DIAGNOSIS — Z825 Family history of asthma and other chronic lower respiratory diseases: Secondary | ICD-10-CM | POA: Diagnosis not present

## 2014-11-01 DIAGNOSIS — Z79899 Other long term (current) drug therapy: Secondary | ICD-10-CM | POA: Diagnosis not present

## 2014-11-01 DIAGNOSIS — Z933 Colostomy status: Secondary | ICD-10-CM | POA: Diagnosis not present

## 2014-11-01 DIAGNOSIS — Z85038 Personal history of other malignant neoplasm of large intestine: Secondary | ICD-10-CM | POA: Diagnosis present

## 2014-11-01 DIAGNOSIS — Z08 Encounter for follow-up examination after completed treatment for malignant neoplasm: Secondary | ICD-10-CM | POA: Diagnosis not present

## 2014-11-01 DIAGNOSIS — Z9049 Acquired absence of other specified parts of digestive tract: Secondary | ICD-10-CM | POA: Diagnosis not present

## 2014-11-01 NOTE — Anesthesia Preprocedure Evaluation (Addendum)
Anesthesia Evaluation  Patient identified by MRN, date of birth, ID band Patient awake    Reviewed: Allergy & Precautions, H&P , NPO status , Patient's Chart, lab work & pertinent test results  Airway Mallampati: I  TM Distance: >3 FB Neck ROM: full    Dental  (+) Chipped   Pulmonary neg pulmonary ROS,  breath sounds clear to auscultation  Pulmonary exam normal       Cardiovascular Exercise Tolerance: Good negative cardio ROS Normal cardiovascular examRhythm:regular Rate:Normal     Neuro/Psych negative neurological ROS  negative psych ROS   GI/Hepatic negative GI ROS, Neg liver ROS, Hx of colon cancer, resected and colostomy reversed.   Endo/Other  negative endocrine ROS  Renal/GU negative Renal ROS  negative genitourinary   Musculoskeletal   Abdominal   Peds  Hematology negative hematology ROS (+)   Anesthesia Other Findings Past Medical History:   Constipation                                                 Reproductive/Obstetrics negative OB ROS                            Anesthesia Physical Anesthesia Plan  ASA: II  Anesthesia Plan: General   Post-op Pain Management:    Induction:   Airway Management Planned:   Additional Equipment:   Intra-op Plan:   Post-operative Plan:   Informed Consent: I have reviewed the patients History and Physical, chart, labs and discussed the procedure including the risks, benefits and alternatives for the proposed anesthesia with the patient or authorized representative who has indicated his/her understanding and acceptance.     Plan Discussed with: Anesthesiologist, CRNA and Surgeon  Anesthesia Plan Comments:        Anesthesia Quick Evaluation

## 2014-11-02 ENCOUNTER — Encounter
Admission: RE | Disposition: A | Discharge status: Home / Self Care | Payer: Self-pay | Source: Ambulatory Visit | Attending: Gastroenterology

## 2014-11-02 ENCOUNTER — Ambulatory Visit
Admission: RE | Admit: 2014-11-02 | Discharge: 2014-11-02 | Disposition: A | Discharge status: Home / Self Care | Payer: Medicare Other | Source: Ambulatory Visit | Attending: Gastroenterology | Admitting: Gastroenterology

## 2014-11-02 ENCOUNTER — Ambulatory Visit: Payer: Medicare Other | Admitting: Anesthesiology

## 2014-11-02 ENCOUNTER — Encounter: Payer: Self-pay | Admitting: *Deleted

## 2014-11-02 DIAGNOSIS — Z85038 Personal history of other malignant neoplasm of large intestine: Secondary | ICD-10-CM | POA: Insufficient documentation

## 2014-11-02 DIAGNOSIS — Z8489 Family history of other specified conditions: Secondary | ICD-10-CM | POA: Insufficient documentation

## 2014-11-02 DIAGNOSIS — Z08 Encounter for follow-up examination after completed treatment for malignant neoplasm: Secondary | ICD-10-CM | POA: Insufficient documentation

## 2014-11-02 DIAGNOSIS — Z79899 Other long term (current) drug therapy: Secondary | ICD-10-CM | POA: Insufficient documentation

## 2014-11-02 DIAGNOSIS — Z933 Colostomy status: Secondary | ICD-10-CM | POA: Insufficient documentation

## 2014-11-02 DIAGNOSIS — Z825 Family history of asthma and other chronic lower respiratory diseases: Secondary | ICD-10-CM | POA: Insufficient documentation

## 2014-11-02 DIAGNOSIS — Z807 Family history of other malignant neoplasms of lymphoid, hematopoietic and related tissues: Secondary | ICD-10-CM | POA: Insufficient documentation

## 2014-11-02 DIAGNOSIS — Z9049 Acquired absence of other specified parts of digestive tract: Secondary | ICD-10-CM | POA: Insufficient documentation

## 2014-11-02 HISTORY — PX: COLONOSCOPY WITH PROPOFOL: SHX5780

## 2014-11-02 SURGERY — COLONOSCOPY WITH PROPOFOL
Anesthesia: General

## 2014-11-02 MED ORDER — PROPOFOL INFUSION 10 MG/ML OPTIME
INTRAVENOUS | Status: DC | PRN
  Administered 2014-11-02: 140 ug/kg/min via INTRAVENOUS

## 2014-11-02 MED ORDER — SODIUM CHLORIDE 0.9 % IV SOLN: INTRAVENOUS | Status: DC

## 2014-11-02 MED ORDER — PHENYLEPHRINE HCL 10 MG/ML IJ SOLN
INTRAMUSCULAR | Status: DC | PRN
  Administered 2014-11-02: 100 ug via INTRAVENOUS

## 2014-11-02 MED ORDER — SODIUM CHLORIDE 0.9 % IV SOLN
INTRAVENOUS | Status: DC
  Administered 2014-11-02: 13:00:00 via INTRAVENOUS
  Administered 2014-11-02: 1000 mL via INTRAVENOUS

## 2014-11-02 MED ORDER — FENTANYL CITRATE (PF) 100 MCG/2ML IJ SOLN
INTRAMUSCULAR | Status: DC | PRN
  Administered 2014-11-02: 50 ug via INTRAVENOUS

## 2014-11-02 NOTE — Transfer of Care (Signed)
Immediate Anesthesia Transfer of Care Note  Patient: Stacey Miranda  Procedure(s) Performed: Procedure(s): COLONOSCOPY WITH PROPOFOL (N/A)  Patient Location: PACU and Endoscopy Unit  Anesthesia Type:General  Level of Consciousness: awake, alert  and oriented  Airway & Oxygen Therapy: Patient Spontanous Breathing and Patient connected to nasal cannula oxygen  Post-op Assessment: Report given to RN and Post -op Vital signs reviewed and stable  Post vital signs: Reviewed and stable  Last Vitals:  Filed Vitals:   11/02/14 1351  BP: 92/42  Pulse: 77  Temp: 37.2 C  Resp: 16    Complications: No apparent anesthesia complications

## 2014-11-02 NOTE — Brief Op Note (Signed)
14:15     IV removed catheter removed.  Intact..... Dry and intact.

## 2014-11-02 NOTE — Discharge Instructions (Signed)

## 2014-11-02 NOTE — Anesthesia Postprocedure Evaluation (Signed)
  Anesthesia Post-op Note  Patient: Stacey Miranda  Procedure(s) Performed: Procedure(s): COLONOSCOPY WITH PROPOFOL (N/A)  Anesthesia type:General  Patient location: PACU  Post pain: Pain level controlled  Post assessment: Post-op Vital signs reviewed, Patient's Cardiovascular Status Stable, Respiratory Function Stable, Patent Airway and No signs of Nausea or vomiting  Post vital signs: Reviewed and stable  Last Vitals:  Filed Vitals:   11/02/14 1401  BP: 112/59  Pulse: 71  Temp:   Resp: 15    Level of consciousness: awake, alert  and patient cooperative  Complications: No apparent anesthesia complications

## 2014-11-02 NOTE — H&P (Signed)
  Primary Care Physician:  Albina Billet, MD  Pre-Procedure History & Physical: HPI:  Stacey Miranda is a 69 y.o. female is here for an colonoscopy.   Past Medical History  Diagnosis Date  . Constipation     Past Surgical History  Procedure Laterality Date  . Colostomy N/A 08/25/2014    Procedure: COLOSTOMY;  Surgeon: Molly Maduro, MD;  Location: [facility] ORS;  Service: General;  Laterality: N/A;  . Colon surgery      Prior to Admission medications   Medication Sig Start Date End Date Taking? Authorizing Provider  acetaminophen (TYLENOL) 500 MG tablet Take 500 mg by mouth every 6 (six) hours as needed.   Yes Historical Provider, MD  bisacodyl (DULCOLAX) 5 MG EC tablet Take by mouth daily as needed for moderate constipation.   Yes Historical Provider, MD  docusate sodium (COLACE) 100 MG capsule Take 100 mg by mouth 2 (two) times daily.   Yes Historical Provider, MD  Lactobacillus Rhamnosus, GG, ([employer] PROBIOTIC, LACTOBACILLUS, PO) Take by mouth.   Yes Historical Provider, MD    Allergies as of 10/31/2014  . (No Known Allergies)    Family History  Problem Relation Age of Onset  . Hodgkin's lymphoma Mother   . COPD Father   . Fibroids Sister     Social History   Social History  . Marital Status: Married    Spouse Name: N/A  . Number of Children: N/A  . Years of Education: N/A   Occupational History  . Not on file.   Social History Main Topics  . Smoking status: Never Smoker   . Smokeless tobacco: Not on file  . Alcohol Use: No  . Drug Use: No  . Sexual Activity: Not on file   Other Topics Concern  . Not on file   Social History Narrative     Physical Exam: BP 129/66 mmHg  Pulse 94  Temp(Src) 98.4 F (36.9 C) (Oral)  Resp 20  Ht 5\' 2"  (1.575 m)  Wt 49.896 kg (110 lb)  BMI 20.11 kg/m2  SpO2 98% General:   Alert,  pleasant and cooperative in NAD Head:  Normocephalic and atraumatic. Neck:  Supple; no masses or thyromegaly. Lungs:  Clear throughout to  auscultation.    Heart:  Regular rate and rhythm. Abdomen:  Soft, nontender and nondistended. Normal bowel sounds, without guarding, and without rebound.   Neurologic:  Alert and  oriented x4;  grossly normal neurologically.  Impression/Plan: Stacey Miranda is here for an colonoscopy to be performed for colon ca s/p resection  09/2014  Risks, benefits, limitations, and alternatives regarding  colonoscopy have been reviewed with the patient.  Questions have been answered.  All parties agreeable.   Josefine Class, MD  11/02/2014, 1:06 PM

## 2014-11-02 NOTE — Op Note (Signed)
[facility] Gastroenterology Patient Name: Stacey Miranda Procedure Date: 11/02/2014 12:30 PM MRN: [id number] Account #: [id number] Date of Birth: 1945/09/24 Admit Type: Outpatient Age: 69 Room: [facility] ENDO ROOM 3 Gender: Female Note Status: Finalized Procedure:         Colonoscopy Indications:       Personal history of malignant neoplasm of the colon,                     diverting colostomy 08/2014, s/p resection 09/25/2014,                     colonoscpy 08/2014 only advanced to distal transverse mass. Patient Profile:   This is a 69 year old female. Providers:         Gerrit Heck. Rayann Heman, MD Referring MD:      Karmen Stabs MD, Kem Parkinson MD                    Leona Carry Hall Busing, MD (Referring MD) Medicines:         Propofol per Anesthesia Complications:     No immediate complications. Procedure:         Pre-Anesthesia Assessment:                    - Prior to the procedure, a History and Physical was                     performed, and patient medications, allergies and                     sensitivities were reviewed. The patient's tolerance of                     previous anesthesia was reviewed.                    After obtaining informed consent, the colonoscope was                     passed under direct vision. Throughout the procedure, the                     patient's blood pressure, pulse, and oxygen saturations                     were monitored continuously. The Colonoscope was                     introduced through the anus and advanced to the the cecum,                     identified by appendiceal orifice and ileocecal valve. The                     colonoscopy was somewhat difficult due to a tortuous                     colon. The patient tolerated the procedure well. The                     quality of the bowel preparation was good. Findings:      The perianal and digital rectal examinations were normal.      There was evidence of a prior end-to-side  colo-colonic anastomosis in  the distal transverse colon. This was patent. This was characterized by       healthy appearing mucosa. This was traversed.      The exam was otherwise without abnormality on direct and retroflexion       views. Impression:        - Patent end-to-side colo-colonic anastomosis,                     characterized by healthy appearing mucosa.                    - The examination was otherwise normal on direct and                     retroflexion views.                    - No specimens collected. Recommendation:    - Observe patient in GI recovery unit.                    - High fiber diet.                    - Continue present medications.                    - Repeat colonoscopy in 1 year for surveillance based on                     personal history of colon cancer.                    - Return to referring physician.                    - The findings and recommendations were discussed with the                     patient.                    - The findings and recommendations were discussed with the                     patient's family. Procedure Code(s): --- Professional ---                    [id number], Colonoscopy, flexible; diagnostic, including                     collection of specimen(s) by brushing or washing, when                     performed (separate procedure) CPT copyright 2014 [employer]. All rights reserved. The codes documented in this report are preliminary and upon coder review may  be revised to meet current compliance requirements. Mellody Life, MD 11/02/2014 1:49:58 PM This report has been signed electronically. Number of Addenda: 0 Note Initiated On: 11/02/2014 12:30 PM Scope Withdrawal Time: 0 hours 19 minutes 20 seconds  Total Procedure Duration: 0 hours 27 minutes 20 seconds       [facility]

## 2014-11-05 ENCOUNTER — Encounter: Payer: Self-pay | Admitting: Gastroenterology

## 2014-12-24 ENCOUNTER — Other Ambulatory Visit: Payer: Self-pay | Admitting: Internal Medicine

## 2014-12-24 DIAGNOSIS — Z1231 Encounter for screening mammogram for malignant neoplasm of breast: Secondary | ICD-10-CM

## 2014-12-27 ENCOUNTER — Other Ambulatory Visit: Payer: Self-pay | Admitting: Internal Medicine

## 2014-12-27 ENCOUNTER — Ambulatory Visit
Admission: RE | Admit: 2014-12-27 | Discharge: 2014-12-27 | Disposition: A | Discharge status: Home / Self Care | Payer: Medicare Other | Source: Ambulatory Visit | Attending: Internal Medicine | Admitting: Internal Medicine

## 2014-12-27 DIAGNOSIS — Z1231 Encounter for screening mammogram for malignant neoplasm of breast: Secondary | ICD-10-CM

## 2014-12-27 HISTORY — DX: Personal history of antineoplastic chemotherapy: Z92.21

## 2014-12-27 HISTORY — DX: Malignant (primary) neoplasm, unspecified: C80.1

## 2015-05-12 ENCOUNTER — Observation Stay
Admission: EM | Admit: 2015-05-12 | Discharge: 2015-05-13 | Disposition: A | Discharge status: Home / Self Care | Payer: Medicare Other | Attending: Internal Medicine | Admitting: Internal Medicine

## 2015-05-12 ENCOUNTER — Emergency Department: Payer: Medicare Other

## 2015-05-12 DIAGNOSIS — Z825 Family history of asthma and other chronic lower respiratory diseases: Secondary | ICD-10-CM | POA: Diagnosis not present

## 2015-05-12 DIAGNOSIS — Z9221 Personal history of antineoplastic chemotherapy: Secondary | ICD-10-CM | POA: Insufficient documentation

## 2015-05-12 DIAGNOSIS — Z803 Family history of malignant neoplasm of breast: Secondary | ICD-10-CM | POA: Diagnosis not present

## 2015-05-12 DIAGNOSIS — Z8489 Family history of other specified conditions: Secondary | ICD-10-CM | POA: Insufficient documentation

## 2015-05-12 DIAGNOSIS — R112 Nausea with vomiting, unspecified: Secondary | ICD-10-CM | POA: Insufficient documentation

## 2015-05-12 DIAGNOSIS — R1032 Left lower quadrant pain: Secondary | ICD-10-CM | POA: Diagnosis not present

## 2015-05-12 DIAGNOSIS — E860 Dehydration: Secondary | ICD-10-CM | POA: Diagnosis not present

## 2015-05-12 DIAGNOSIS — Z807 Family history of other malignant neoplasms of lymphoid, hematopoietic and related tissues: Secondary | ICD-10-CM | POA: Insufficient documentation

## 2015-05-12 DIAGNOSIS — K5900 Constipation, unspecified: Secondary | ICD-10-CM | POA: Insufficient documentation

## 2015-05-12 DIAGNOSIS — R140 Abdominal distension (gaseous): Secondary | ICD-10-CM | POA: Diagnosis not present

## 2015-05-12 DIAGNOSIS — Z85038 Personal history of other malignant neoplasm of large intestine: Secondary | ICD-10-CM | POA: Diagnosis not present

## 2015-05-12 DIAGNOSIS — Z9049 Acquired absence of other specified parts of digestive tract: Secondary | ICD-10-CM | POA: Diagnosis not present

## 2015-05-12 DIAGNOSIS — R1030 Lower abdominal pain, unspecified: Secondary | ICD-10-CM | POA: Diagnosis present

## 2015-05-12 DIAGNOSIS — Z7982 Long term (current) use of aspirin: Secondary | ICD-10-CM | POA: Diagnosis not present

## 2015-05-12 LAB — URINALYSIS COMPLETE WITH MICROSCOPIC (ARMC ONLY)
Bacteria, UA: NONE SEEN
Bilirubin Urine: NEGATIVE
Glucose, UA: 50 mg/dL — AB
Hgb urine dipstick: NEGATIVE
Leukocytes, UA: NEGATIVE
Nitrite: NEGATIVE
Protein, ur: NEGATIVE mg/dL
Specific Gravity, Urine: 1.046 — ABNORMAL HIGH (ref 1.005–1.030)
Squamous Epithelial / LPF: NONE SEEN
pH: 8 (ref 5.0–8.0)

## 2015-05-12 LAB — COMPREHENSIVE METABOLIC PANEL
ALBUMIN: 4.5 g/dL (ref 3.5–5.0)
ALT: 28 U/L (ref 14–54)
ANION GAP: 16 — AB (ref 5–15)
AST: 44 U/L — ABNORMAL HIGH (ref 15–41)
Alkaline Phosphatase: 155 U/L — ABNORMAL HIGH (ref 38–126)
BUN: 13 mg/dL (ref 6–20)
CHLORIDE: 104 mmol/L (ref 101–111)
CO2: 20 mmol/L — AB (ref 22–32)
Calcium: 9.2 mg/dL (ref 8.9–10.3)
Creatinine, Ser: 0.82 mg/dL (ref 0.44–1.00)
GFR calc non Af Amer: >60 mL/min (ref >60)
GLUCOSE: 166 mg/dL — AB (ref 65–99)
POTASSIUM: 3.7 mmol/L (ref 3.5–5.1)
SODIUM: 140 mmol/L (ref 135–145)
Total Bilirubin: 1.2 mg/dL (ref 0.3–1.2)
Total Protein: 7.8 g/dL (ref 6.5–8.1)

## 2015-05-12 LAB — TYPE AND SCREEN
ABO/RH(D): A POS
ANTIBODY SCREEN: NEGATIVE

## 2015-05-12 LAB — CBC
HEMATOCRIT: 39.5 % (ref 35.0–47.0)
HEMOGLOBIN: 13.1 g/dL (ref 12.0–16.0)
MCH: 32.3 pg (ref 26.0–34.0)
MCHC: 33.3 g/dL (ref 32.0–36.0)
MCV: 97.1 fL (ref 80.0–100.0)
Platelets: 260 10*3/uL (ref 150–440)
RBC: 4.07 MIL/uL (ref 3.80–5.20)
RDW: 16.7 % — ABNORMAL HIGH (ref 11.5–14.5)
WBC: 11.4 10*3/uL — ABNORMAL HIGH (ref 3.6–11.0)

## 2015-05-12 LAB — TSH: TSH: 1.839 u[IU]/mL (ref 0.350–4.500)

## 2015-05-12 LAB — LACTIC ACID, PLASMA
Lactic Acid, Venous: 2.1 mmol/L (ref 0.5–2.0)
Lactic Acid, Venous: 1.8 mmol/L (ref 0.5–2.0)

## 2015-05-12 LAB — HEMOGLOBIN A1C: Hgb A1c MFr Bld: 5.1 % (ref 4.0–6.0)

## 2015-05-12 LAB — ABO/RH: ABO/RH(D): A POS

## 2015-05-12 MED ORDER — ONDANSETRON HCL 4 MG/2ML IJ SOLN
4.0000 mg | Freq: Once | INTRAMUSCULAR | Status: AC | PRN
  Administered 2015-05-12: 4 mg via INTRAVENOUS
  Filled 2015-05-12 (×2): qty 2

## 2015-05-12 MED ORDER — ONDANSETRON HCL 4 MG PO TABS: 4.0000 mg | ORAL_TABLET | Freq: Four times a day (QID) | ORAL | Status: DC | PRN

## 2015-05-12 MED ORDER — VITAMIN D (ERGOCALCIFEROL) 1.25 MG (50000 UNIT) PO CAPS
50000.0000 [IU] | ORAL_CAPSULE | ORAL | Status: DC
Start: 2015-05-12 — End: 2015-05-13
  Filled 2015-05-12: qty 1

## 2015-05-12 MED ORDER — SODIUM CHLORIDE 0.9 % IV BOLUS (SEPSIS)
1000.0000 mL | Freq: Once | INTRAVENOUS | Status: AC
  Administered 2015-05-12: 1000 mL via INTRAVENOUS

## 2015-05-12 MED ORDER — ONDANSETRON HCL 4 MG/2ML IJ SOLN
4.0000 mg | Freq: Once | INTRAMUSCULAR | Status: AC
  Administered 2015-05-12: 4 mg via INTRAVENOUS

## 2015-05-12 MED ORDER — ENOXAPARIN SODIUM 40 MG/0.4ML ~~LOC~~ SOLN
40.0000 mg | SUBCUTANEOUS | Status: DC
  Administered 2015-05-12: 21:00:00 40 mg via SUBCUTANEOUS
  Filled 2015-05-12: qty 0.4

## 2015-05-12 MED ORDER — BISACODYL 5 MG PO TBEC
5.0000 mg | DELAYED_RELEASE_TABLET | Freq: Every day | ORAL | Status: DC | PRN
  Administered 2015-05-12 (×2): 5 mg via ORAL
  Filled 2015-05-12 (×2): qty 1

## 2015-05-12 MED ORDER — ACETAMINOPHEN 325 MG PO TABS
650.0000 mg | ORAL_TABLET | Freq: Four times a day (QID) | ORAL | Status: DC | PRN
  Administered 2015-05-13: 06:00:00 650 mg via ORAL
  Filled 2015-05-12 (×2): qty 2

## 2015-05-12 MED ORDER — IOHEXOL 240 MG/ML SOLN
25.0000 mL | INTRAMUSCULAR | Status: AC
  Administered 2015-05-12: 25 mL via ORAL

## 2015-05-12 MED ORDER — ONDANSETRON HCL 4 MG/2ML IJ SOLN
4.0000 mg | Freq: Four times a day (QID) | INTRAMUSCULAR | Status: DC | PRN
  Administered 2015-05-12: 4 mg via INTRAVENOUS
  Filled 2015-05-12: qty 2

## 2015-05-12 MED ORDER — HYDROMORPHONE HCL 1 MG/ML IJ SOLN
1.0000 mg | Freq: Once | INTRAMUSCULAR | Status: AC
  Administered 2015-05-12: 1 mg via INTRAVENOUS

## 2015-05-12 MED ORDER — SODIUM CHLORIDE 0.9 % IV SOLN
INTRAVENOUS | Status: DC
  Administered 2015-05-12 – 2015-05-13 (×3): via INTRAVENOUS

## 2015-05-12 MED ORDER — HYDROMORPHONE HCL 1 MG/ML IJ SOLN
INTRAMUSCULAR | Status: AC
  Administered 2015-05-12: 1 mg via INTRAVENOUS
  Filled 2015-05-12: qty 1

## 2015-05-12 MED ORDER — IOHEXOL 300 MG/ML  SOLN
75.0000 mL | Freq: Once | INTRAMUSCULAR | Status: AC | PRN
  Administered 2015-05-12: 75 mL via INTRAVENOUS

## 2015-05-12 MED ORDER — ASPIRIN EC 81 MG PO TBEC
81.0000 mg | DELAYED_RELEASE_TABLET | Freq: Every day | ORAL | Status: DC
  Administered 2015-05-12 – 2015-05-13 (×2): 81 mg via ORAL
  Filled 2015-05-12 (×2): qty 1

## 2015-05-12 MED ORDER — ACETAMINOPHEN 650 MG RE SUPP: 650.0000 mg | Freq: Four times a day (QID) | RECTAL | Status: DC | PRN

## 2015-05-12 MED ORDER — SENNA 8.6 MG PO TABS
1.0000 | ORAL_TABLET | Freq: Two times a day (BID) | ORAL | Status: DC
  Administered 2015-05-12 (×2): 8.6 mg via ORAL
  Filled 2015-05-12 (×2): qty 1

## 2015-05-12 MED ORDER — MORPHINE SULFATE (PF) 2 MG/ML IV SOLN: 2.0000 mg | INTRAVENOUS | Status: DC | PRN

## 2015-05-12 NOTE — ED Provider Notes (Signed)
[facility] Emergency Department Provider Note  ____________________________________________   I have reviewed the triage vital signs and the nursing notes.   HISTORY  Chief Complaint Abdominal Pain and Emesis    HPI Stacey Miranda is a 70 y.o. female with a history of colon cancer, status post colectomy in June of last year, no longer on chemoradiation had a negative CT scan about 3 or 4 days ago. States that she has had a history of constipation she is dependent upon accident his. She states that she took 3 laxatives today because she has not had a satisfying bowel movement since Wednesday. After the laxatives she had a gradual onset significant lower abdominal pain which is cramping in nature and significant. She did not have a bowel movement. She did vomit a few times he states. Nonbloody nonbilious. She presents complaining of lower abdominal discomfort. She denies any chest pain or shortness of breath. She denies any melena bright red blood per rectum or hematemesis. She's had no fever or chills. She states that since severe cramp.  Past Medical History  Diagnosis Date  . Constipation   . Cancer [facility])     colon cancer june 2016  . S/P chemotherapy, time since 4-12 weeks     colon cancer    Patient Active Problem List   Diagnosis Date Noted  . Malnutrition of moderate degree ([place]) 08/25/2014  . Colonic mass 08/24/2014  . Blood in stool 08/21/2014  . Constipation 08/21/2014  . Nausea with vomiting 08/21/2014  . Change in bowel habits 08/21/2014  . Loss of weight 08/21/2014    Past Surgical History  Procedure Laterality Date  . Colostomy N/A 08/25/2014    Procedure: COLOSTOMY;  Surgeon: Molly Maduro, MD;  Location: [facility] ORS;  Service: General;  Laterality: N/A;  . Colon surgery    . Colonoscopy with propofol N/A 11/02/2014    Procedure: COLONOSCOPY WITH PROPOFOL;  Surgeon: Josefine Class, MD;  Location: [facility] ENDOSCOPY;  Service: Endoscopy;   Laterality: N/A;    Current Outpatient Rx  Name  Route  Sig  Dispense  Refill  . acetaminophen (TYLENOL) 500 MG tablet   Oral   Take 500 mg by mouth every 6 (six) hours as needed.         Marland Kitchen aspirin EC 81 MG tablet   Oral   Take 81 mg by mouth daily.         . Vitamin D, Ergocalciferol, (DRISDOL) 50000 units CAPS capsule   Oral   Take 50,000 Units by mouth every 7 (seven) days.           Allergies Review of patient's allergies indicates no known allergies.  Family History  Problem Relation Age of Onset  . Hodgkin's lymphoma Mother   . COPD Father   . Fibroids Sister   . Breast cancer Paternal Aunt 19    Social History Social History  Substance Use Topics  . Smoking status: Never Smoker   . Smokeless tobacco: Not on file  . Alcohol Use: No    Review of Systems Constitutional: No fever/chills Eyes: No visual changes. ENT: No sore throat. No stiff neck no neck pain Cardiovascular: Denies chest pain. Respiratory: Denies shortness of breath. Gastrointestinal:   See history of present illness Genitourinary: Negative for dysuria. Musculoskeletal: Negative lower extremity swelling Skin: Negative for rash. Neurological: Negative for headaches, focal weakness or numbness. 10-point ROS otherwise negative.  ____________________________________________   PHYSICAL EXAM:  VITAL SIGNS: ED Triage Vitals  Enc Vitals Group     BP 05/12/15 0029 148/97 mmHg     Pulse Rate 05/12/15 0029 90     Resp 05/12/15 0029 26     Temp 05/12/15 0029 98.2 F (36.8 C)     Temp Source 05/12/15 0029 Oral     SpO2 05/12/15 0029 98 %     Weight 05/12/15 0029 106 lb (48.081 kg)     Height 05/12/15 0029 5\' 2"  (1.575 m)     Head Cir --      Peak Flow --      Pain Score 05/12/15 0030 8     Pain Loc --      Pain Edu? --      Excl. in [place]? --     Constitutional: Alert and oriented. Well appearing and in no acute distress. Anxious and upset but nontoxic Eyes: Conjunctivae are  normal. PERRL. EOMI. Head: Atraumatic. Nose: No congestion/rhinnorhea. Mouth/Throat: Mucous membranes are moist.  Oropharynx non-erythematous. Neck: No stridor.   Nontender with no meningismus Cardiovascular: Normal rate, regular rhythm. Grossly normal heart sounds.  Good peripheral circulation. Respiratory: Normal respiratory effort.  No retractions. Lungs CTAB. Abdominal: Soft diffuse lower abdominal discomfort. No distention. No guarding no rebound Back:  There is no focal tenderness or step off there is no midline tenderness there are no lesions noted. there is no CVA tenderness Musculoskeletal: No lower extremity tenderness. No joint effusions, no DVT signs strong distal pulses no edema Neurologic:  Normal speech and language. No gross focal neurologic deficits are appreciated.  Skin:  Skin is warm, dry and intact. No rash noted. Psychiatric: Mood and affect are normal. Speech and behavior are normal.  ____________________________________________   LABS (all labs ordered are listed, but only abnormal results are displayed)  Labs Reviewed  COMPREHENSIVE METABOLIC PANEL - Abnormal; Notable for the following:    CO2 20 (*)    Glucose, Bld 166 (*)    AST 44 (*)    Alkaline Phosphatase 155 (*)    Anion gap 16 (*)    All other components within normal limits  CBC - Abnormal; Notable for the following:    WBC 11.4 (*)    RDW 16.7 (*)    All other components within normal limits  LACTIC ACID, PLASMA - Abnormal; Notable for the following:    Lactic Acid, Venous 2.1 (*)    All other components within normal limits  URINALYSIS COMPLETEWITH MICROSCOPIC ([facility] ONLY)  LACTIC ACID, PLASMA  TYPE AND SCREEN  ABO/RH   ____________________________________________  EKG  I personally interpreted any EKGs ordered by me or triage  ____________________________________________  RADIOLOGY  I reviewed any imaging ordered by me or triage that were performed during my  shift ____________________________________________   PROCEDURES  Procedure(s) performed: None  Critical Care performed: None  ____________________________________________   INITIAL IMPRESSION / ASSESSMENT AND PLAN / ED COURSE  Pertinent labs & imaging results that were available during my care of the patient were reviewed by me and considered in my medical decision making (see chart for details).  Patient with abdominal pain after taking laxatives. Certainly could be cramping from the medicine himself but given her history and she will need a CT scan. I did give her 0.5 of Dilaudid initially and she has been pain-free ever since. She is relaxed and comfortable drinking the contrast. Awaiting CT results for this patient. ____________________________________________   FINAL CLINICAL IMPRESSION(S) / ED DIAGNOSES  Final diagnoses:  None  This chart was dictated using voice recognition software.  Despite best efforts to proofread,  errors can occur which can change meaning.     Schuyler Amor, MD 05/12/15 [id number]

## 2015-05-12 NOTE — Care Management Obs Status (Signed)
[place] NOTIFICATION   Patient Details  Name: Stacey Miranda MRN: [id number] Date of Birth: 04/12/1945   Medicare Observation Status Notification Given:  Yes (abdominal pain with nausea and vomiting)    Ival Bible, RN 05/12/2015, 2:44 PM

## 2015-05-12 NOTE — H&P (Signed)
Stacey Miranda is an 70 y.o. female.   Chief Complaint: Abdominal pain HPI: The patient presents emergency department complaining of abdominal pain that began today. This is a recurrent complaint as the patient has recently undergone colon resection secondary to colon cancer. She had been eating well until this morning when she began as some abdominal pain and had multiple episodes of nonbloody nonbilious emesis. In the emergency department she received antiemetics but still vomited. Laboratory evaluation reveals elevated lactic acid indicating dehydration for which the emergency department staff called for admission.  Past Medical History  Diagnosis Date  . Constipation   . Cancer [facility])     colon cancer june 2016  . S/P chemotherapy, time since 4-12 weeks     colon cancer    Past Surgical History  Procedure Laterality Date  . Colostomy N/A 08/25/2014    Procedure: COLOSTOMY;  Surgeon: Molly Maduro, MD;  Location: [facility] ORS;  Service: General;  Laterality: N/A;  . Colon surgery    . Colonoscopy with propofol N/A 11/02/2014    Procedure: COLONOSCOPY WITH PROPOFOL;  Surgeon: Josefine Class, MD;  Location: [facility] ENDOSCOPY;  Service: Endoscopy;  Laterality: N/A;    Family History  Problem Relation Age of Onset  . Hodgkin's lymphoma Mother   . COPD Father   . Fibroids Sister   . Breast cancer Paternal Aunt 41   Social History:  reports that she has never smoked. She does not have any smokeless tobacco history on file. She reports that she does not drink alcohol or use illicit drugs.  Allergies: No Known Allergies  Medications Prior to Admission  Medication Sig Dispense Refill  . aspirin EC 81 MG tablet Take 81 mg by mouth daily.    . Vitamin D, Ergocalciferol, (DRISDOL) 50000 units CAPS capsule Take 50,000 Units by mouth every 7 (seven) days.    Marland Kitchen acetaminophen (TYLENOL) 500 MG tablet Take 500 mg by mouth every 6 (six) hours as needed. Reported on 05/12/2015      Results for  orders placed or performed during the hospital encounter of 05/12/15 (from the past 48 hour(s))  Comprehensive metabolic panel     Status: Abnormal   Collection Time: 05/12/15 12:32 AM  Result Value Ref Range   Sodium 140 135 - 145 mmol/L   Potassium 3.7 3.5 - 5.1 mmol/L   Chloride 104 101 - 111 mmol/L   CO2 20 (L) 22 - 32 mmol/L   Glucose, Bld 166 (H) 65 - 99 mg/dL   BUN 13 6 - 20 mg/dL   Creatinine, Ser 0.82 0.44 - 1.00 mg/dL   Calcium 9.2 8.9 - 10.3 mg/dL   Total Protein 7.8 6.5 - 8.1 g/dL   Albumin 4.5 3.5 - 5.0 g/dL   AST 44 (H) 15 - 41 U/L   ALT 28 14 - 54 U/L   Alkaline Phosphatase 155 (H) 38 - 126 U/L   Total Bilirubin 1.2 0.3 - 1.2 mg/dL   GFR calc non Af Amer >60 >60 mL/min   GFR calc Af Amer >60 >60 mL/min    Comment: (NOTE) The eGFR has been calculated using the CKD EPI equation. This calculation has not been validated in all clinical situations. eGFR's persistently <60 mL/min signify possible Chronic Kidney Disease.    Anion gap 16 (H) 5 - 15  CBC     Status: Abnormal   Collection Time: 05/12/15 12:32 AM  Result Value Ref Range   WBC 11.4 (H) 3.6 - 11.0 K/uL  RBC 4.07 3.80 - 5.20 MIL/uL   Hemoglobin 13.1 12.0 - 16.0 g/dL   HCT 39.5 35.0 - 47.0 %   MCV 97.1 80.0 - 100.0 fL   MCH 32.3 26.0 - 34.0 pg   MCHC 33.3 32.0 - 36.0 g/dL   RDW 16.7 (H) 11.5 - 14.5 %   Platelets 260 150 - 440 K/uL  Lactic acid, plasma     Status: Abnormal   Collection Time: 05/12/15  1:17 AM  Result Value Ref Range   Lactic Acid, Venous 2.1 (HH) 0.5 - 2.0 mmol/L    Comment: CRITICAL RESULT CALLED TO, READ BACK BY AND VERIFIED WITH NOEL WEBSTER AT 0220 05/12/15.PMH  Type and screen [place]     Status: None   Collection Time: 05/12/15  1:17 AM  Result Value Ref Range   ABO/RH(D) A POS    Antibody Screen NEG    Sample Expiration 05/15/2015   ABO/Rh     Status: None   Collection Time: 05/12/15  1:17 AM  Result Value Ref Range   ABO/RH(D) A POS    Ct Abdomen  Pelvis W Contrast  05/12/2015  CLINICAL DATA:  Acute onset of left lower quadrant abdominal pain and nausea. Initial encounter. EXAM: CT ABDOMEN AND PELVIS WITH CONTRAST TECHNIQUE: Multidetector CT imaging of the abdomen and pelvis was performed using the standard protocol following bolus administration of intravenous contrast. CONTRAST:  34m OMNIPAQUE IOHEXOL 300 MG/ML  SOLN COMPARISON:  CT of the abdomen and pelvis performed 08/24/2014 FINDINGS: The visualized lung bases are clear. Trace ascites is noted tracking about the liver and spleen, and extending along the paracolic gutters and about small bowel loops. A small amount of free fluid is seen within the pelvis. The liver and spleen are unremarkable in appearance. The gallbladder is within normal limits. The pancreas and adrenal glands are unremarkable. The kidneys are unremarkable in appearance. There is no evidence of hydronephrosis. No renal or ureteral stones are seen. No perinephric stranding is appreciated. There is diffuse wall thickening along a short segment of mid ileum, with focal narrowing more proximally, decompression distally, and diffuse distention of small-bowel loops above this segment, measuring up to 3.3 cm in diameter. This likely reflects small bowel dysmotility due to an infectious or inflammatory process. Underlying adhesion cannot be entirely excluded. The stomach is within normal limits. No acute vascular abnormalities are seen. The remaining colon is grossly unremarkable. The patient is status post partial colectomy. The bladder is moderately distended and grossly unremarkable. The uterus is unremarkable in appearance. The ovaries are relatively symmetric. No suspicious adnexal masses are seen. No inguinal lymphadenopathy is seen. No acute osseous abnormalities are identified. IMPRESSION: 1. Diffuse wall thickening along a short segment of mid ileum, with focal narrowing more proximally, decompression distally, and diffuse  distention of small-bowel loops above this segment, measuring up to 3.3 cm in diameter. This likely reflects small bowel dysmotility due to an infectious or inflammatory process. Underlying adhesion cannot be excluded, though no definite high-grade small bowel obstruction is yet seen. 2. Trace ascites noted tracking about the abdomen and pelvis. Electronically Signed   By: JGarald BaldingM.D.   On: 05/12/2015 04:27    Review of Systems  Constitutional: Negative for fever and chills.  HENT: Negative for sore throat and tinnitus.   Eyes: Negative for blurred vision and redness.  Respiratory: Negative for cough and shortness of breath.   Cardiovascular: Negative for chest pain, palpitations, orthopnea and PND.  Gastrointestinal:  Positive for nausea, vomiting and constipation. Negative for abdominal pain and diarrhea.  Genitourinary: Negative for dysuria, urgency and frequency.  Musculoskeletal: Negative for myalgias and joint pain.  Skin: Negative for rash.       No lesions  Neurological: Negative for speech change, focal weakness and weakness.  Endo/Heme/Allergies: Does not bruise/bleed easily.       No temperature intolerance  Psychiatric/Behavioral: Negative for depression and suicidal ideas.    Blood pressure 153/83, pulse 80, temperature 99.1 F (37.3 C), temperature source Oral, resp. rate 21, height 5' 2" (1.575 m), weight 47.718 kg (105 lb 3.2 oz), SpO2 100 %. Physical Exam  Nursing note and vitals reviewed. Constitutional: She is oriented to person, place, and time. She appears well-developed and well-nourished. No distress.  HENT:  Head: Normocephalic and atraumatic.  Mouth/Throat: Oropharynx is clear and moist.  Eyes: Conjunctivae and EOM are normal. Pupils are equal, round, and reactive to light. No scleral icterus.  Neck: Normal range of motion. Neck supple. No JVD present. No tracheal deviation present. No thyromegaly present.  Cardiovascular: Normal rate, regular rhythm and  normal heart sounds.  Exam reveals no gallop and no friction rub.   No murmur heard. Respiratory: Effort normal and breath sounds normal.  Port in place right chest wall  GI: Soft. Bowel sounds are normal. She exhibits no distension and no mass. There is tenderness. There is no rebound and no guarding.  Genitourinary:  Deferred  Musculoskeletal: Normal range of motion. She exhibits no edema.  Lymphadenopathy:    She has no cervical adenopathy.  Neurological: She is alert and oriented to person, place, and time. No cranial nerve deficit. She exhibits normal muscle tone.  Skin: Skin is warm and dry.  Psychiatric: She has a normal mood and affect. Her behavior is normal. Judgment and thought content normal.     Assessment/Plan This is a 70-year-old female status post colon resection admitted for dehydration secondary to nausea and vomiting. 1. Dehydration: Secondary to vomiting. She received some fluid in the emergency department and we will continue to fluid resuscitate the patient with normal saline. 2. SIRS: Patient barely meets criteria via leukocytosis and intermittent tachypnea. She has no source of infection. At this time I will defer antibiotics. Continue to monitor her clinical condition. 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 3. Constipation: We will give the patient laxatives as well as senna for a mild stimulant. Continue to hydrate as this may help the patient had a bowel movement as well. 4. Nausea and vomiting: Antiemetics as needed 5. DVT prophylaxis: Lovenox 6. GI prophylaxis: None The patient is a full code. Time spent on admission orders and patient care approximately 35 minutes   Diamond,  Michael S, MD 05/12/2015, 6:53 AM    

## 2015-05-12 NOTE — ED Notes (Signed)
Pt states history of colon ca, pt actively vomiting in triage, pt states began to have lower abd pain with vomiting this pm. Pt pale, tachypnic in triage. Pt denies known fever.

## 2015-05-12 NOTE — ED Notes (Signed)
LACTIC 2.1, Nevin Bloodgood, DR [place],

## 2015-05-13 MED ORDER — POLYETHYLENE GLYCOL 3350 17 G PO PACK
17.0000 g | PACK | Freq: Every day | ORAL | Status: DC
Start: 2015-05-13 — End: 2015-05-13
  Filled 2015-05-13: qty 1

## 2015-05-13 NOTE — Discharge Instructions (Signed)

## 2015-05-13 NOTE — Discharge Summary (Signed)
Randal Buba, 70 y.o., DOB [birth year], MRN [id number]. Admission date: 05/12/2015 Discharge Date 05/13/2015 Primary MD Albina Billet, MD Admitting Physician Harrie Foreman, MD  Admission Diagnosis  Lower abdominal pain [R10.30] Non-intractable vomiting with nausea, vomiting of unspecified type [R11.2]  Discharge Diagnosis   Active Problems:   Abdominal pain unspecified now resolved Intractable nausea and vomiting now resolved Dehydration Sirs ruled out constipation History of colon cancer     Hospital Course  The patient presents emergency department complaining of abdominal pain. This is a recurrent complaint as the patient has  undergone colon resection secondary to colon cancer. Patient presented to the ED with these complaints underwent a CT  Scan which showed wall thickening in the short segment of the mid ileum with focal narrowing which apparently patient has had before. She also was noted to have some lactic acid level being elevated. She was admitted to the hospital and started on bowel regimen patient had very good bowel movements. Her abdominal pain is now resolved and distention is resolved. She chronically follows up with her oncologist for the colon cancer she'll need to keep that appointment. At this point she is tolerating diet well and is stable for discharge.          Consults  None  Significant Tests:  See full reports for all details      Ct Abdomen Pelvis W Contrast  05/12/2015  CLINICAL DATA:  Acute onset of left lower quadrant abdominal pain and nausea. Initial encounter. EXAM: CT ABDOMEN AND PELVIS WITH CONTRAST TECHNIQUE: Multidetector CT imaging of the abdomen and pelvis was performed using the standard protocol following bolus administration of intravenous contrast. CONTRAST:  22mL OMNIPAQUE IOHEXOL 300 MG/ML  SOLN COMPARISON:  CT of the abdomen and pelvis performed 08/24/2014 FINDINGS: The visualized lung bases are clear. Trace ascites is noted tracking  about the liver and spleen, and extending along the paracolic gutters and about small bowel loops. A small amount of free fluid is seen within the pelvis. The liver and spleen are unremarkable in appearance. The gallbladder is within normal limits. The pancreas and adrenal glands are unremarkable. The kidneys are unremarkable in appearance. There is no evidence of hydronephrosis. No renal or ureteral stones are seen. No perinephric stranding is appreciated. There is diffuse wall thickening along a short segment of mid ileum, with focal narrowing more proximally, decompression distally, and diffuse distention of small-bowel loops above this segment, measuring up to 3.3 cm in diameter. This likely reflects small bowel dysmotility due to an infectious or inflammatory process. Underlying adhesion cannot be entirely excluded. The stomach is within normal limits. No acute vascular abnormalities are seen. The remaining colon is grossly unremarkable. The patient is status post partial colectomy. The bladder is moderately distended and grossly unremarkable. The uterus is unremarkable in appearance. The ovaries are relatively symmetric. No suspicious adnexal masses are seen. No inguinal lymphadenopathy is seen. No acute osseous abnormalities are identified. IMPRESSION: 1. Diffuse wall thickening along a short segment of mid ileum, with focal narrowing more proximally, decompression distally, and diffuse distention of small-bowel loops above this segment, measuring up to 3.3 cm in diameter. This likely reflects small bowel dysmotility due to an infectious or inflammatory process. Underlying adhesion cannot be excluded, though no definite high-grade small bowel obstruction is yet seen. 2. Trace ascites noted tracking about the abdomen and pelvis. Electronically Signed   By: Garald Balding M.D.   On: 05/12/2015 04:27       Today  Subjective:   Stacey Miranda  Feels well abdominal pain resolved  Objective:   Blood  pressure 123/66, pulse 69, temperature 97.3 F (36.3 C), temperature source Oral, resp. rate 16, height 5\' 2"  (1.575 m), weight 49.351 kg (108 lb 12.8 oz), SpO2 100 %.  .  Intake/Output Summary (Last 24 hours) at 05/13/15 1247 Last data filed at 05/13/15 0800  Gross per 24 hour  Intake   1920 ml  Output      1 ml  Net   1919 ml    Exam VITAL SIGNS: Blood pressure 123/66, pulse 69, temperature 97.3 F (36.3 C), temperature source Oral, resp. rate 16, height 5\' 2"  (1.575 m), weight 49.351 kg (108 lb 12.8 oz), SpO2 100 %.  GENERAL:  70 y.o.-year-old patient lying in the bed with no acute distress.  EYES: Pupils equal, round, reactive to light and accommodation. No scleral icterus. Extraocular muscles intact.  HEENT: Head atraumatic, normocephalic. Oropharynx and nasopharynx clear.  NECK:  Supple, no jugular venous distention. No thyroid enlargement, no tenderness.  LUNGS: Normal breath sounds bilaterally, no wheezing, rales,rhonchi or crepitation. No use of accessory muscles of respiration.  CARDIOVASCULAR: S1, S2 normal. No murmurs, rubs, or gallops.  ABDOMEN: Soft, nontender, nondistended. Bowel sounds present. No organomegaly or mass.  EXTREMITIES: No pedal edema, cyanosis, or clubbing.  NEUROLOGIC: Cranial nerves II through XII are intact. Muscle strength 5/5 in all extremities. Sensation intact. Gait not checked.  PSYCHIATRIC: The patient is alert and oriented x 3.  SKIN: No obvious rash, lesion, or ulcer.   Data Review     CBC w Diff: Lab Results  Component Value Date   WBC 11.4* 05/12/2015   HGB 13.1 05/12/2015   HCT 39.5 05/12/2015   PLT 260 05/12/2015   LYMPHOPCT 15 08/24/2014   MONOPCT 7 08/24/2014   EOSPCT 1 08/24/2014   BASOPCT 3 08/24/2014   CMP: Lab Results  Component Value Date   NA 140 05/12/2015   K 3.7 05/12/2015   CL 104 05/12/2015   CO2 20* 05/12/2015   BUN 13 05/12/2015   CREATININE 0.82 05/12/2015   PROT 7.8 05/12/2015   ALBUMIN 4.5 05/12/2015    BILITOT 1.2 05/12/2015   ALKPHOS 155* 05/12/2015   AST 44* 05/12/2015   ALT 28 05/12/2015  .  Micro Results No results found for this or any previous visit (from the past 240 hour(s)).      Code Status Orders        Start     Ordered   05/12/15 0616  Full code   Continuous     05/12/15 0615    Code Status History    Date Active Date Inactive Code Status Order ID Comments User Context   08/24/2014 10:32 PM 08/29/2014  4:07 PM Full Code [id number]  Marlyce Huge, MD ED    Advance Directive Documentation        Most Recent Value   Type of Advance Directive  Healthcare Power of Attorney, Living will   Pre-existing out of facility DNR order (yellow form or pink MOST form)     "MOST" Form in Place?            Follow-up Information    Follow up with Albina Billet, MD. Go on 05/21/2015.   Specialty:  Internal Medicine   Why:  at 2:45 p.m.   Contact information:   [place] [address]   [place] [address] [phone]       Discharge Medications  Medication List    TAKE these medications        acetaminophen 500 MG tablet  Commonly known as:  TYLENOL  Take 500 mg by mouth every 6 (six) hours as needed. Reported on 05/12/2015     aspirin EC 81 MG tablet  Take 81 mg by mouth daily.     Vitamin D (Ergocalciferol) 50000 units Caps capsule  Commonly known as:  DRISDOL  Take 50,000 Units by mouth every 7 (seven) days.           Total Time in preparing paper work, data evaluation and todays exam - 35 minutes  Dustin Flock M.D on 05/13/2015 at 12:47 PM  [facility]   Office  [phone]

## 2015-11-01 ENCOUNTER — Encounter: Payer: Self-pay | Admitting: *Deleted

## 2015-11-04 ENCOUNTER — Ambulatory Visit
Admission: RE | Admit: 2015-11-04 | Discharge: 2015-11-04 | Disposition: A | Discharge status: Home / Self Care | Payer: Medicare Other | Source: Ambulatory Visit | Attending: Unknown Physician Specialty | Admitting: Unknown Physician Specialty

## 2015-11-04 ENCOUNTER — Ambulatory Visit: Payer: Medicare Other | Admitting: Anesthesiology

## 2015-11-04 ENCOUNTER — Encounter: Payer: Self-pay | Admitting: *Deleted

## 2015-11-04 ENCOUNTER — Encounter
Admission: RE | Disposition: A | Discharge status: Home / Self Care | Payer: Self-pay | Source: Ambulatory Visit | Attending: Unknown Physician Specialty

## 2015-11-04 DIAGNOSIS — Z79899 Other long term (current) drug therapy: Secondary | ICD-10-CM | POA: Insufficient documentation

## 2015-11-04 DIAGNOSIS — K640 First degree hemorrhoids: Secondary | ICD-10-CM | POA: Diagnosis not present

## 2015-11-04 DIAGNOSIS — Z7982 Long term (current) use of aspirin: Secondary | ICD-10-CM | POA: Diagnosis not present

## 2015-11-04 DIAGNOSIS — R1310 Dysphagia, unspecified: Secondary | ICD-10-CM | POA: Insufficient documentation

## 2015-11-04 DIAGNOSIS — Z85038 Personal history of other malignant neoplasm of large intestine: Secondary | ICD-10-CM | POA: Diagnosis not present

## 2015-11-04 DIAGNOSIS — Z1211 Encounter for screening for malignant neoplasm of colon: Secondary | ICD-10-CM | POA: Diagnosis not present

## 2015-11-04 DIAGNOSIS — R1013 Epigastric pain: Secondary | ICD-10-CM | POA: Insufficient documentation

## 2015-11-04 HISTORY — PX: ESOPHAGOGASTRODUODENOSCOPY (EGD) WITH PROPOFOL: SHX5813

## 2015-11-04 HISTORY — PX: COLONOSCOPY WITH PROPOFOL: SHX5780

## 2015-11-04 SURGERY — COLONOSCOPY WITH PROPOFOL
Anesthesia: General

## 2015-11-04 MED ORDER — FENTANYL CITRATE (PF) 100 MCG/2ML IJ SOLN
INTRAMUSCULAR | Status: DC | PRN
  Administered 2015-11-04: 50 ug via INTRAVENOUS

## 2015-11-04 MED ORDER — PROPOFOL 500 MG/50ML IV EMUL
INTRAVENOUS | Status: DC | PRN
  Administered 2015-11-04: 120 ug/kg/min via INTRAVENOUS

## 2015-11-04 MED ORDER — MIDAZOLAM HCL 2 MG/2ML IJ SOLN
INTRAMUSCULAR | Status: DC | PRN
  Administered 2015-11-04: 1 mg via INTRAVENOUS

## 2015-11-04 MED ORDER — EPHEDRINE SULFATE 50 MG/ML IJ SOLN
INTRAMUSCULAR | Status: DC | PRN
  Administered 2015-11-04: 10 mg via INTRAVENOUS

## 2015-11-04 MED ORDER — PIPERACILLIN-TAZOBACTAM 3.375 G IVPB 30 MIN
3.3750 g | Freq: Once | INTRAVENOUS | Status: AC
  Administered 2015-11-04: 3.375 g via INTRAVENOUS
  Filled 2015-11-04: qty 50

## 2015-11-04 MED ORDER — SODIUM CHLORIDE 0.9 % IV SOLN: INTRAVENOUS | Status: DC

## 2015-11-04 MED ORDER — LIDOCAINE HCL (CARDIAC) 20 MG/ML IV SOLN
INTRAVENOUS | Status: DC | PRN
  Administered 2015-11-04: 30 mg via INTRAVENOUS

## 2015-11-04 MED ORDER — SODIUM CHLORIDE 0.9 % IV SOLN
INTRAVENOUS | Status: DC
  Administered 2015-11-04: 13:00:00 via INTRAVENOUS

## 2015-11-04 NOTE — Transfer of Care (Signed)
Immediate Anesthesia Transfer of Care Note  Patient: Stacey Miranda  Procedure(s) Performed: Procedure(s): COLONOSCOPY WITH PROPOFOL (N/A) ESOPHAGOGASTRODUODENOSCOPY (EGD) WITH PROPOFOL (N/A)  Patient Location: PACU  Anesthesia Type:General  Level of Consciousness: awake and sedated  Airway & Oxygen Therapy: Patient Spontanous Breathing and Patient connected to nasal cannula oxygen  Post-op Assessment: Report given to RN and Post -op Vital signs reviewed and stable  Post vital signs: Reviewed and stable  Last Vitals:  Vitals:   11/04/15 1233 11/04/15 1352  BP: 136/72 (!) 98/54  Pulse: 90 83  Resp: 18 16  Temp: 36.9 C (!) 35.7 C    Last Pain:  Vitals:   11/04/15 1352  TempSrc: Tympanic         Complications: No apparent anesthesia complications

## 2015-11-04 NOTE — Op Note (Signed)
[facility] Gastroenterology Patient Name: Stacey Miranda Procedure Date: 11/04/2015 12:45 PM MRN: [id number] Account #: [id number] Date of Birth: December 28, 1945 Admit Type: Outpatient Age: 70 Room: [facility] ENDO ROOM 1 Gender: Female Note Status: Finalized Procedure:            Colonoscopy Indications:          High risk colon cancer surveillance: Personal history                        of colon cancer Providers:            Manya Silvas, MD Referring MD:         Leona Carry. Hall Busing, MD (Referring MD) Medicines:            Propofol per Anesthesia Complications:        No immediate complications. Procedure:            Pre-Anesthesia Assessment:                       - After reviewing the risks and benefits, the patient                        was deemed in satisfactory condition to undergo the                        procedure.                       After obtaining informed consent, the colonoscope was                        passed under direct vision. Throughout the procedure,                        the patient's blood pressure, pulse, and oxygen                        saturations were monitored continuously. The                        Colonoscope was introduced through the anus and                        advanced to the the cecum, identified by appendiceal                        orifice and ileocecal valve. The colonoscopy was                        somewhat difficult due to restricted mobility of the                        colon. Successful completion of the procedure was aided                        by applying abdominal pressure. The patient tolerated                        the procedure well. The quality of the bowel  preparation was excellent. Findings:      Internal hemorrhoids were found during endoscopy. The hemorrhoids were       small and Grade I (internal hemorrhoids that do not prolapse).      The colon (entire examined portion) appeared  normal. No inflammation, no       polyps, no masses. One suture was seen. Impression:           - Internal hemorrhoids.                       - The entire examined colon is normal.                       - No specimens collected. Recommendation:       - Repeat colonoscopy in 1 year for surveillance based                        on personal history of colon cancer. Procedure Code(s):    --- Professional ---                       [phone], Colonoscopy, flexible; diagnostic, including                        collection of specimen(s) by brushing or washing, when                        performed (separate procedure) Diagnosis Code(s):    --- Professional ---                       [id number], Personal history of other malignant neoplasm                        of large intestine                       K64.0, First degree hemorrhoids CPT copyright 2016 American [employer]. All rights reserved. The codes documented in this report are preliminary and upon coder review may  be revised to meet current compliance requirements. Manya Silvas, MD 11/04/2015 1:55:51 PM This report has been signed electronically. Number of Addenda: 0 Note Initiated On: 11/04/2015 12:45 PM Scope Withdrawal Time: 0 hours 15 minutes 17 seconds  Total Procedure Duration: 0 hours 27 minutes 8 seconds       [facility]

## 2015-11-04 NOTE — Anesthesia Postprocedure Evaluation (Signed)
Anesthesia Post Note  Patient: Stacey Miranda  Procedure(s) Performed: Procedure(s) (LRB): COLONOSCOPY WITH PROPOFOL (N/A) ESOPHAGOGASTRODUODENOSCOPY (EGD) WITH PROPOFOL (N/A)  Patient location during evaluation: Endoscopy Anesthesia Type: General Level of consciousness: awake and alert Pain management: pain level controlled Vital Signs Assessment: post-procedure vital signs reviewed and stable Respiratory status: spontaneous breathing, nonlabored ventilation, respiratory function stable and patient connected to nasal cannula oxygen Cardiovascular status: blood pressure returned to baseline and stable Postop Assessment: no signs of nausea or vomiting Anesthetic complications: no    Last Vitals:  Vitals:   11/04/15 1412 11/04/15 1422  BP: (!) 106/57 105/64  Pulse: 75 74  Resp: 11 16  Temp:      Last Pain:  Vitals:   11/04/15 1352  TempSrc: Tympanic                 Martha Clan

## 2015-11-04 NOTE — H&P (Signed)
Primary Care Physician:  Albina Billet, MD Primary Gastroenterologist:  Dr. Vira Agar  Pre-Procedure History & Physical: HPI:  Stacey Miranda is a 70 y.o. female is here for an endoscopy and colonoscopy.   Past Medical History:  Diagnosis Date  . Cancer [facility])    colon cancer june 2016  . Constipation   . S/P chemotherapy, time since 4-12 weeks    colon cancer    Past Surgical History:  Procedure Laterality Date  . COLON SURGERY    . COLONOSCOPY WITH PROPOFOL N/A 11/02/2014   Procedure: COLONOSCOPY WITH PROPOFOL;  Surgeon: Josefine Class, MD;  Location: [facility] ENDOSCOPY;  Service: Endoscopy;  Laterality: N/A;  . COLOSTOMY N/A 08/25/2014   Procedure: COLOSTOMY;  Surgeon: Molly Maduro, MD;  Location: [facility] ORS;  Service: General;  Laterality: N/A;    Prior to Admission medications   Medication Sig Start Date End Date Taking? Authorizing Provider  acetaminophen (TYLENOL) 500 MG tablet Take 500 mg by mouth every 6 (six) hours as needed. Reported on 05/12/2015   Yes Historical Provider, MD  ALPHA LIPOIC ACID PO Take by mouth.   Yes Historical Provider, MD  aspirin EC 81 MG tablet Take 81 mg by mouth daily.   Yes Historical Provider, MD  b complex vitamins capsule Take 1 capsule by mouth daily.   Yes Historical Provider, MD  bisacodyl (DULCOLAX) 5 MG EC tablet Take 5 mg by mouth daily as needed for moderate constipation.   Yes Historical Provider, MD  Vitamin D, Ergocalciferol, (DRISDOL) 50000 units CAPS capsule Take 50,000 Units by mouth every 7 (seven) days.   Yes Historical Provider, MD    Allergies as of 10/30/2015  . (No Known Allergies)    Family History  Problem Relation Age of Onset  . Hodgkin's lymphoma Mother   . COPD Father   . Fibroids Sister   . Breast cancer Paternal Aunt 96    Social History   Social History  . Marital status: Married    Spouse name: N/A  . Number of children: N/A  . Years of education: N/A   Occupational History  . Not on file.    Social History Main Topics  . Smoking status: Never Smoker  . Smokeless tobacco: Never Used  . Alcohol use No  . Drug use: No  . Sexual activity: Not on file   Other Topics Concern  . Not on file   Social History Narrative  . No narrative on file    Review of Systems: See HPI, otherwise negative ROS  Physical Exam: BP 136/72   Pulse 90   Temp 98.5 F (36.9 C) (Tympanic)   Resp 18   Ht 5\' 2"  (1.575 m)   Wt 52.2 kg (115 lb)   SpO2 100%   BMI 21.03 kg/m  General:   Alert,  pleasant and cooperative in NAD Head:  Normocephalic and atraumatic. Neck:  Supple; no masses or thyromegaly. Lungs:  Clear throughout to auscultation.    Heart:  Regular rate and rhythm. Abdomen:  Soft, nontender and nondistended. Normal bowel sounds, without guarding, and without rebound.   Neurologic:  Alert and  oriented x4;  grossly normal neurologically.  Impression/Plan: Stacey Miranda is here for an endoscopy and colonoscopy to be performed for [facility] colon cancer and vomiting  Risks, benefits, limitations, and alternatives regarding  endoscopy and colonoscopy have been reviewed with the patient.  Questions have been answered.  All parties agreeable.   Gaylyn Cheers, MD  11/04/2015, 1:01  PM

## 2015-11-04 NOTE — Op Note (Signed)
[facility] Gastroenterology Patient Name: Stacey Miranda Procedure Date: 11/04/2015 12:55 PM MRN: [id number] Account #: [id number] Date of Birth: June 20, 1945 Admit Type: Outpatient Age: 70 Room: [facility] ENDO ROOM 1 Gender: Female Note Status: Finalized Procedure:            Upper GI endoscopy Indications:          Dyspepsia, Dysphagia Providers:            Manya Silvas, MD Referring MD:         Leona Carry. Hall Busing, MD (Referring MD) Medicines:            Propofol per Anesthesia Complications:        No immediate complications. Procedure:            Pre-Anesthesia Assessment:                       - After reviewing the risks and benefits, the patient                        was deemed in satisfactory condition to undergo the                        procedure.                       After obtaining informed consent, the endoscope was                        passed under direct vision. Throughout the procedure,                        the patient's blood pressure, pulse, and oxygen                        saturations were monitored continuously. The                        Colonoscope was introduced through the mouth, and                        advanced to the second part of duodenum. The upper GI                        endoscopy was accomplished without difficulty. The                        patient tolerated the procedure well. Findings:      The examined esophagus was normal.      The entire examined stomach was normal.      Mildly erythematous mucosal spot without active bleeding and with no       stigmata of bleeding was found in the duodenal bulb.      The Z-line was irregular and was found 40 cm from the incisors. Impression:           - Normal esophagus.                       - Normal stomach.                       - Erythematous duodenopathy.                       -  No specimens collected. Recommendation:       - Perform a colonoscopy as previously scheduled. Manya Silvas, MD 11/04/2015 1:19:35 PM This report has been signed electronically. Number of Addenda: 0 Note Initiated On: 11/04/2015 12:55 PM      [facility]

## 2015-11-04 NOTE — Anesthesia Preprocedure Evaluation (Signed)
Anesthesia Evaluation  Patient identified by MRN, date of birth, ID band Patient awake    Reviewed: Allergy & Precautions, NPO status , Patient's Chart, lab work & pertinent test results  History of Anesthesia Complications Negative for: history of anesthetic complications  Airway Mallampati: II  TM Distance: >3 FB Neck ROM: Full    Dental no notable dental hx.    Pulmonary neg pulmonary ROS, neg sleep apnea, neg COPD,    breath sounds clear to auscultation- rhonchi (-) wheezing      Cardiovascular Exercise Tolerance: Good (-) hypertension(-) CAD and (-) Past MI negative cardio ROS   Rhythm:Regular Rate:Normal - Systolic murmurs and - Diastolic murmurs    Neuro/Psych negative neurological ROS  negative psych ROS   GI/Hepatic Neg liver ROS, Hx of colon cancer    Endo/Other  negative endocrine ROSneg diabetes  Renal/GU negative Renal ROS     Musculoskeletal negative musculoskeletal ROS (+)   Abdominal (+) - obese,   Peds  Hematology negative hematology ROS (+)   Anesthesia Other Findings Past Medical History: No date: Cancer [facility])     Comment: colon cancer june 2016 No date: Constipation No date: S/P chemotherapy, time since 4-12 weeks     Comment: colon cancer   Reproductive/Obstetrics                             Anesthesia Physical Anesthesia Plan  ASA: II  Anesthesia Plan: General   Post-op Pain Management:    Induction: Intravenous  Airway Management Planned: Natural Airway  Additional Equipment:   Intra-op Plan:   Post-operative Plan:   Informed Consent: I have reviewed the patients History and Physical, chart, labs and discussed the procedure including the risks, benefits and alternatives for the proposed anesthesia with the patient or authorized representative who has indicated his/her understanding and acceptance.     Plan Discussed with: CRNA and  Anesthesiologist  Anesthesia Plan Comments:         Anesthesia Quick Evaluation

## 2015-11-04 NOTE — Anesthesia Procedure Notes (Signed)
Performed by: COOK-MARTIN, Tramya Schoenfelder Pre-anesthesia Checklist: Patient identified, Emergency Drugs available, Suction available, Patient being monitored and Timeout performed Patient Re-evaluated:Patient Re-evaluated prior to inductionOxygen Delivery Method: Nasal cannula Preoxygenation: Pre-oxygenation with 100% oxygen Intubation Type: IV induction Airway Equipment and Method: Bite block Placement Confirmation: CO2 detector and positive ETCO2     

## 2015-11-05 ENCOUNTER — Encounter: Payer: Self-pay | Admitting: Unknown Physician Specialty

## 2016-01-08 IMAGING — CT CT ABD-PELV W/ CM
2 of 5 series · 14 of 46 positions shown, 16 images · IV contrast (omnipaque)
Comparison: Abdominal radiograph performed 08/08/2014, and pelvic
ultrasound performed 08/09/2014

CLINICAL DATA: Subacute onset of constipation. Vomiting. Single
episode of rectal bleeding. Initial encounter.

EXAM:
CT ABDOMEN AND PELVIS WITH CONTRAST
TECHNIQUE: Multidetector CT imaging of the abdomen and pelvis was performed
using the standard protocol following bolus administration of
intravenous contrast.
CONTRAST:  100mL OMNIPAQUE IOHEXOL 300 MG/ML  SOLN

[Series 2: routine abd pel with · axial · 0.64mm/px · z∈[-268,+122]mm · 11 of 88 slices shown, 13 images]
[im 5/88  soft-tissue]
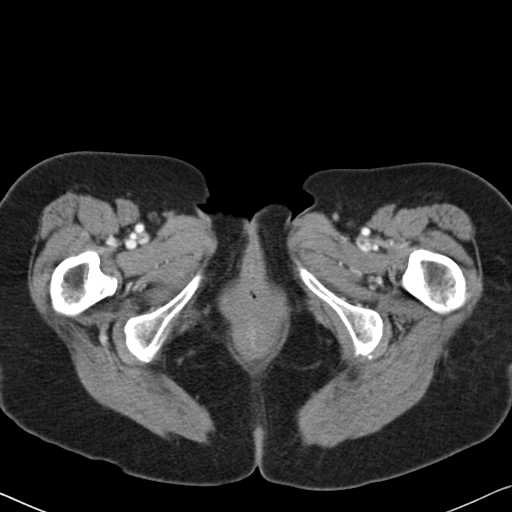
[im 5/88  bone]
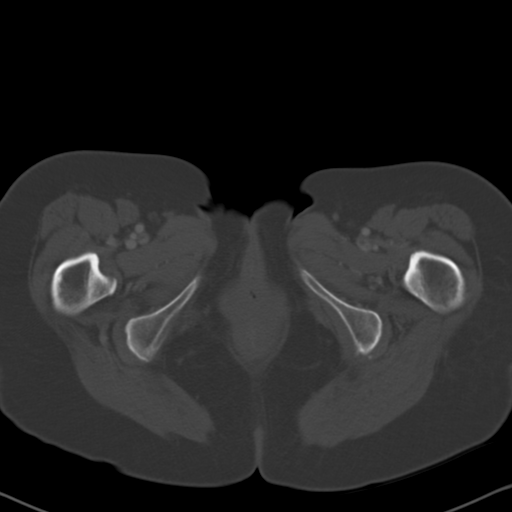
[im 14/88  soft-tissue]
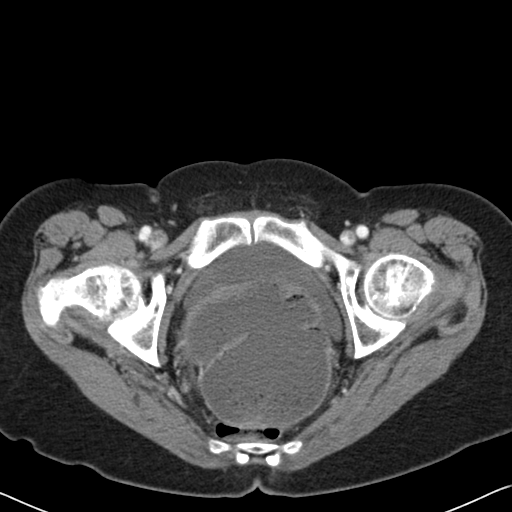
[im 22/88  soft-tissue]
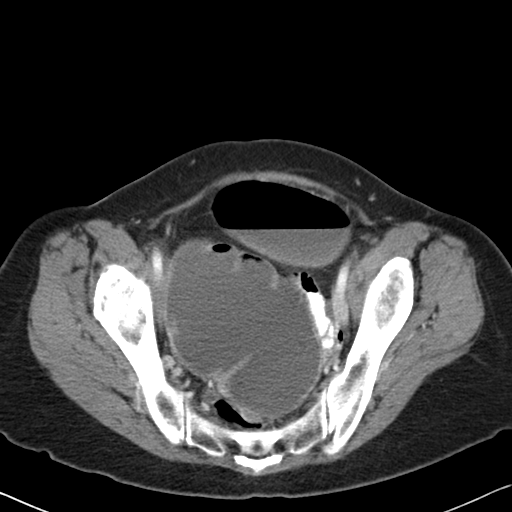
[im 31/88  soft-tissue]
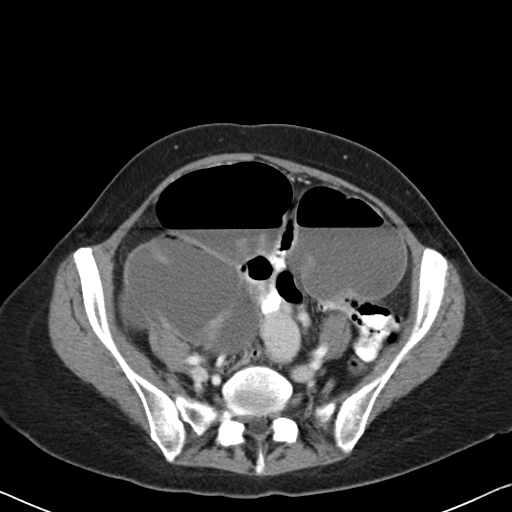
[im 35/88  soft-tissue]
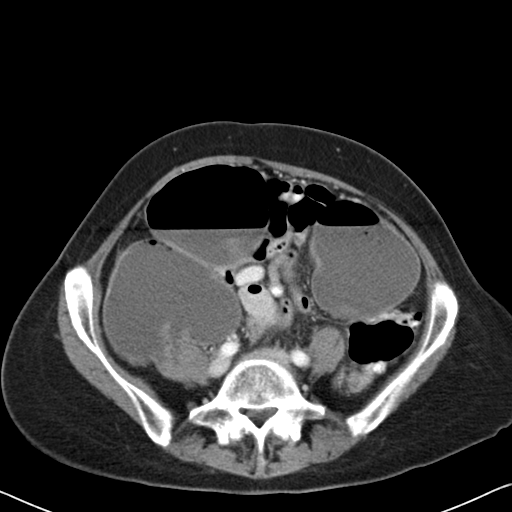
[im 44/88  soft-tissue]
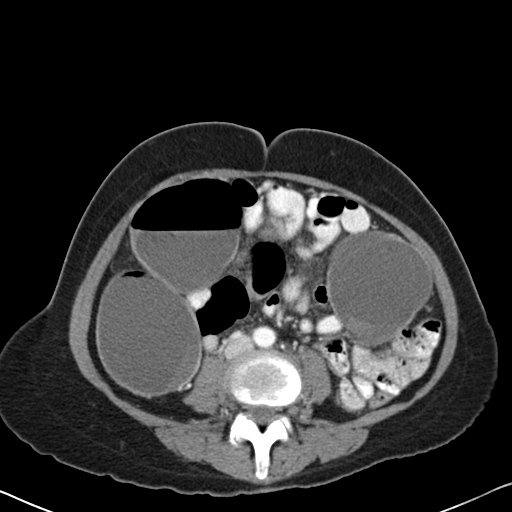
[im 53/88  soft-tissue]
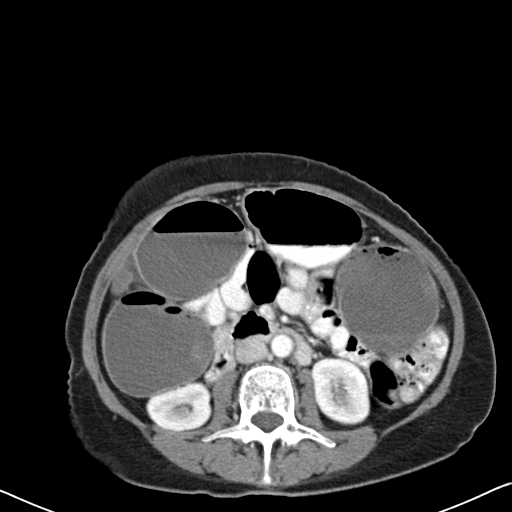
[im 57/88  soft-tissue]
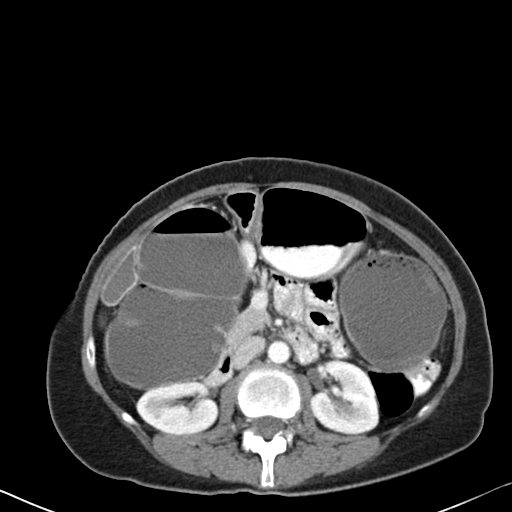
[im 66/88  soft-tissue]
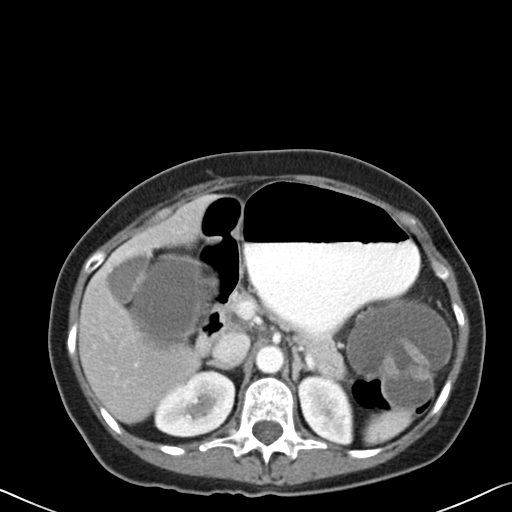
[im 66/88  bone]
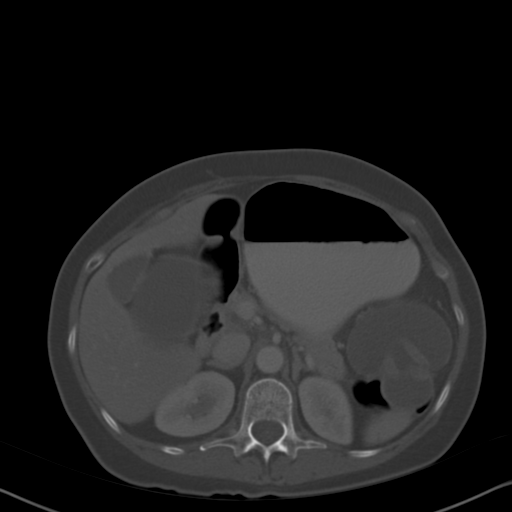
[im 74/88  soft-tissue]
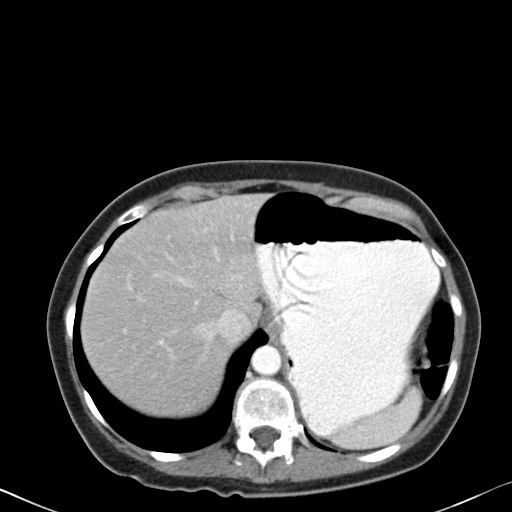
[im 83/88  soft-tissue]
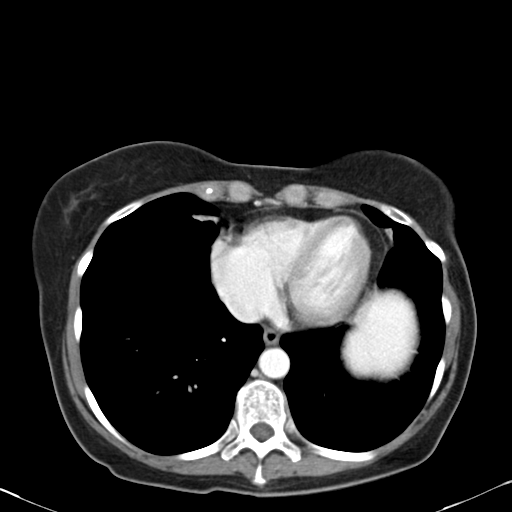

[Series 5: cor routine abd pel with · coronal · 0.56mm/px · 3 of 117 slices shown]
[im 39/117  soft-tissue]
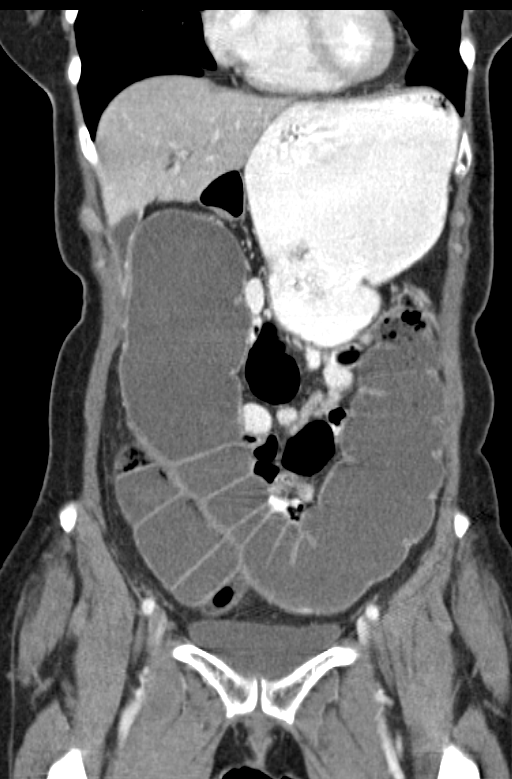
[im 52/117  soft-tissue]
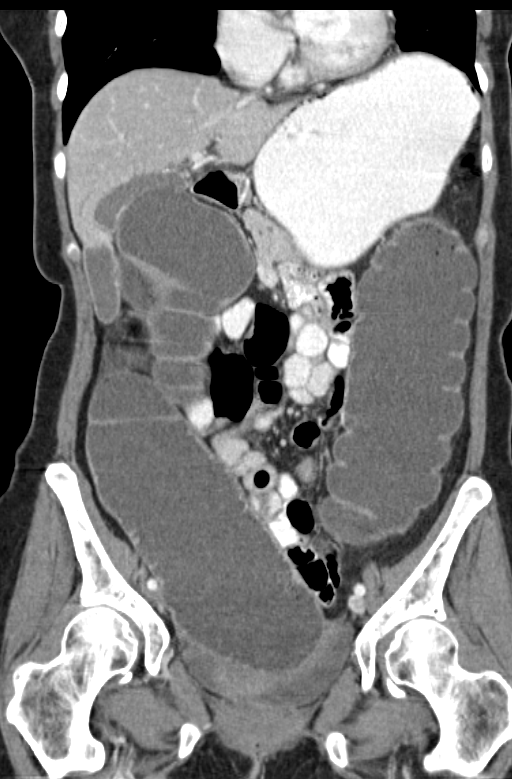
[im 65/117  soft-tissue]
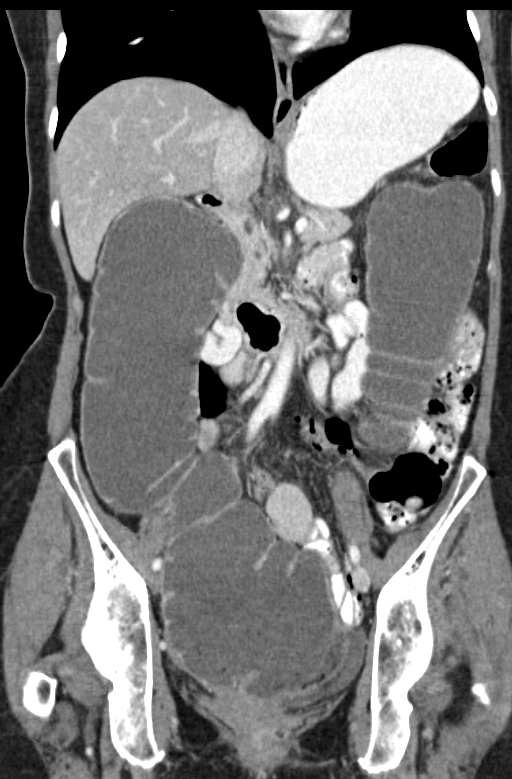

[14 of 46 positions shown; findings below may reference images not displayed]

FINDINGS: The visualized lung bases are clear.

The liver and spleen are unremarkable in appearance. The gallbladder
is within normal limits. The pancreas and adrenal glands are
unremarkable.

The kidneys are unremarkable in appearance. There is no evidence of
hydronephrosis. No renal or ureteral stones are seen. No perinephric
stranding is appreciated.

No free fluid is identified. The small bowel is unremarkable in
appearance. The stomach is within normal limits. No acute vascular
abnormalities are seen.

There is mild distention of the cecum to 9.4 cm, and dilatation of
the ascending and transverse colon to 7.7 cm in maximal diameter.
This reflects partial obstruction due to a focal colonic mass just
before the splenic flexure of the colon, measuring 3.1 x 2.0 cm. Its
appearance is compatible with primary colonic malignancy.

A few small adjacent soft tissue nodules are seen, measuring up to 6
mm in short axis, likely reflecting adjacent nodes.

A small amount of air is noted within the colon distal to the mass,
but the descending and sigmoid colon are otherwise decompressed.

The cecum is situated deep within the pelvis. The appendix is noted
at the right hemipelvis, also filled with fluid, without evidence
for appendicitis.

The bladder is displaced and compressed anteriorly by the distended
cecum, but grossly unremarkable in appearance. The uterus is grossly
unremarkable, though also compressed by the distended cecum. No
suspicious adnexal masses are characterized. No inguinal
lymphadenopathy is seen.

No acute osseous abnormalities are identified. There is vague
nonspecific heterogeneity within the visualized osseous structures,
without focal bony mass.
IMPRESSION: 1. Partial obstruction at the level of the splenic flexure of the
colon due to a focal colonic mass, measuring 3.1 x 2.0 cm.
Distention of the cecum to 9.4 cm, and dilatation of the ascending
and transverse colon to 7.7 cm.
2. Few mildly prominent adjacent lymph nodes seen, measuring up to 6
mm in short axis, concerning for regional spread of disease.
3. The cecum is situated deep within the pelvis, and thus compresses
the bladder and uterus, though the pelvis is otherwise unremarkable.
4. Vague nonspecific heterogeneity within the visualized osseous
structures, without a focal bony mass.

These results were called by telephone at the time of interpretation
on 08/24/2014 at [DATE] to Dr. GRETTEL WHEATLEY, who verbally
acknowledged these results.

## 2016-03-20 ENCOUNTER — Other Ambulatory Visit: Payer: Self-pay | Admitting: Internal Medicine

## 2016-03-20 DIAGNOSIS — Z1231 Encounter for screening mammogram for malignant neoplasm of breast: Secondary | ICD-10-CM

## 2016-04-23 ENCOUNTER — Ambulatory Visit
Admission: RE | Admit: 2016-04-23 | Discharge: 2016-04-23 | Disposition: A | Discharge status: Home / Self Care | Payer: Medicare Other | Source: Ambulatory Visit | Attending: Internal Medicine | Admitting: Internal Medicine

## 2016-04-23 DIAGNOSIS — Z1231 Encounter for screening mammogram for malignant neoplasm of breast: Secondary | ICD-10-CM | POA: Insufficient documentation

## 2016-04-23 HISTORY — DX: Personal history of antineoplastic chemotherapy: Z92.21

## 2019-01-20 ENCOUNTER — Other Ambulatory Visit: Payer: Self-pay | Admitting: Internal Medicine

## 2019-01-20 DIAGNOSIS — Z1231 Encounter for screening mammogram for malignant neoplasm of breast: Secondary | ICD-10-CM

## 2019-06-28 ENCOUNTER — Ambulatory Visit
Admission: RE | Admit: 2019-06-28 | Discharge: 2019-06-28 | Disposition: A | Discharge status: Home / Self Care | Payer: Medicare Other | Source: Ambulatory Visit | Attending: Internal Medicine | Admitting: Internal Medicine

## 2019-06-28 DIAGNOSIS — Z1231 Encounter for screening mammogram for malignant neoplasm of breast: Secondary | ICD-10-CM | POA: Insufficient documentation

## 2020-02-27 DIAGNOSIS — Z85828 Personal history of other malignant neoplasm of skin: Secondary | ICD-10-CM | POA: Insufficient documentation

## 2022-04-13 LAB — HM COLONOSCOPY

## 2023-04-15 DIAGNOSIS — K080 Exfoliation of teeth due to systemic causes: Secondary | ICD-10-CM | POA: Diagnosis not present

## 2023-05-03 DIAGNOSIS — I10 Essential (primary) hypertension: Secondary | ICD-10-CM | POA: Diagnosis not present

## 2023-05-03 DIAGNOSIS — M8580 Other specified disorders of bone density and structure, unspecified site: Secondary | ICD-10-CM | POA: Diagnosis not present

## 2023-05-07 DIAGNOSIS — R519 Headache, unspecified: Secondary | ICD-10-CM | POA: Diagnosis not present

## 2023-08-02 DIAGNOSIS — H40003 Preglaucoma, unspecified, bilateral: Secondary | ICD-10-CM | POA: Diagnosis not present

## 2023-08-12 DIAGNOSIS — H43813 Vitreous degeneration, bilateral: Secondary | ICD-10-CM | POA: Diagnosis not present

## 2023-08-12 DIAGNOSIS — H40003 Preglaucoma, unspecified, bilateral: Secondary | ICD-10-CM | POA: Diagnosis not present

## 2023-08-12 DIAGNOSIS — H2513 Age-related nuclear cataract, bilateral: Secondary | ICD-10-CM | POA: Diagnosis not present

## 2023-09-24 ENCOUNTER — Ambulatory Visit: Admitting: Family Medicine

## 2023-10-12 ENCOUNTER — Encounter: Payer: Self-pay | Admitting: Family Medicine

## 2023-10-12 ENCOUNTER — Ambulatory Visit (INDEPENDENT_AMBULATORY_CARE_PROVIDER_SITE_OTHER): Admitting: Family Medicine

## 2023-10-12 VITALS — BP 140/78 | HR 84 | Resp 16 | Ht 62.0 in | Wt 107.7 lb

## 2023-10-12 DIAGNOSIS — Z85828 Personal history of other malignant neoplasm of skin: Secondary | ICD-10-CM | POA: Diagnosis not present

## 2023-10-12 DIAGNOSIS — G43709 Chronic migraine without aura, not intractable, without status migrainosus: Secondary | ICD-10-CM | POA: Diagnosis not present

## 2023-10-12 DIAGNOSIS — Z85038 Personal history of other malignant neoplasm of large intestine: Secondary | ICD-10-CM | POA: Insufficient documentation

## 2023-10-12 DIAGNOSIS — K219 Gastro-esophageal reflux disease without esophagitis: Secondary | ICD-10-CM | POA: Insufficient documentation

## 2023-10-12 DIAGNOSIS — K5909 Other constipation: Secondary | ICD-10-CM | POA: Insufficient documentation

## 2023-10-12 NOTE — Patient Instructions (Signed)
 It was a pleasure to see you today!  Thank you for choosing [facility] for your primary care.     To keep you healthy, please keep in mind the following health maintenance items that you are due for:   Health Maintenance Due  Topic Date Due   Medicare Annual Wellness (AWV)  Never done   Hepatitis C Screening  Never done   DTaP/Tdap/Td (1 - Tdap) Never done   COVID-19 Vaccine (9 - Pfizer risk 2024-25 season) 07/01/2023     Best Wishes,   Dr. Lang

## 2023-10-12 NOTE — Progress Notes (Signed)
 New patient visit   Patient: Stacey Miranda   DOB: [birth year]   78 y.o. Female  MRN: [id number] Visit Date: 10/12/2023  Today's healthcare provider: Rockie Agent, MD   Chief Complaint  Patient presents with   Establish Care    New patient establishing care transferring from Dr. Corlis   Subjective    Stacey Miranda is a 78 y.o. female who presents today as a new patient to establish care.   HPI     Establish Care    Additional comments: New patient establishing care transferring from Dr. Corlis      Last edited by Rosas, Joseline E, CMA on 10/12/2023  2:02 PM.       Discussed the use of AI scribe software for clinical note transcription with the patient, who gave verbal consent to proceed.  History of Present Illness Stacey Miranda is a 78 year old female who presents for establishment of care.  She has a history of colon cancer, for which she underwent a colectomy and colostomy, followed by a reverse colostomy. She experiences constipation and uses Dulcolax 5 mg as needed, describing herself as 'laxative dependent'. She takes Dulcolax once a day, increasing the dose if she does not have a bowel movement for two to three days, typically having bowel movements every other day.  Her medical history includes cataracts, chronic headaches, GERD, neuropathy, tinnitus, and skin cancer. For GERD, she takes omeprazole 40 mg daily, which effectively manages her symptoms. For headaches, she was prescribed a medication stronger than Tylenol , which she has only used once since February. She also takes Benadryl for sleep and sinus issues.  Her medication regimen includes birth control, alpha glycolic acid, and vitamin D , which she takes daily instead of the previously prescribed high-dose weekly regimen. She no longer takes aspirin .  She has a history of skin cancer treated with a Mohs procedure and had shingles two summers ago. She received the original Shingrix vaccine about ten years  ago. She does not recall her last tetanus booster but believes it was within the last twenty years.  Recent lab work from February showed a hematocrit of 47, hemoglobin of 15, MCV of 99, creatinine of 0.78, sodium of 141, potassium of 4.5, bilirubin of 0.4, AST of 22, ALT of 17, alkaline phosphatase of 73, and LDL of 104. Her CEA levels are monitored annually due to her history of colon cancer; she reports that her oncologist advised to keep an eye on it after a slight increase.  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 No history of high blood pressure, typically 110-120 mmHg. No current issues with blood pressure.      Past Medical History:  Diagnosis Date   Cancer [facility])    colon cancer june 2016   Cataract    Chronic headaches    Constipation    Gastric reflux    Glaucoma    Neuropathy    Personal history of chemotherapy    S/P chemotherapy, time since 4-12 weeks    colon cancer   Tinnitus     Outpatient Medications Prior to Visit  Medication Sig   acetaminophen  (TYLENOL ) 500 MG tablet Take 500 mg by mouth every 6 (six) hours as needed. Reported on 05/12/2015   bisacodyl  (DULCOLAX) 5 MG EC tablet Take 5 mg by mouth daily as needed for moderate constipation.   Butalbital-Aspirin -Caffeine (FIORINAL PO) Take 40 mg by mouth as needed.   Cholecalciferol (VITAMIN  D3) 1000 units CAPS Take 1,000 Units by mouth daily.   diphenhydrAMINE (BENADRYL) 25 mg capsule Take 25 mg by mouth at bedtime.   omeprazole (PRILOSEC) 40 MG capsule Take 40 mg by mouth daily.   [DISCONTINUED] ALPHA LIPOIC ACID PO Take by mouth.   [DISCONTINUED] aspirin  EC 81 MG tablet Take 81 mg by mouth daily.   [DISCONTINUED] b complex vitamins capsule Take 1 capsule by mouth daily.   [DISCONTINUED] Vitamin D , Ergocalciferol , (DRISDOL ) 50000 units CAPS capsule Take 50,000 Units by mouth every 7 (seven) days.   No facility-administered medications prior to visit.    Past Surgical History:   Procedure Laterality Date   COLON SURGERY     COLONOSCOPY WITH PROPOFOL  N/A 11/02/2014   Procedure: COLONOSCOPY WITH PROPOFOL ;  Surgeon: Donnice Vaughn Manes, MD;  Location: [facility] ENDOSCOPY;  Service: Endoscopy;  Laterality: N/A;   COLONOSCOPY WITH PROPOFOL  N/A 11/04/2015   Procedure: COLONOSCOPY WITH PROPOFOL ;  Surgeon: Lamar ONEIDA Holmes, MD;  Location: [facility] ENDOSCOPY;  Service: Endoscopy;  Laterality: N/A;   COLOSTOMY N/A 08/25/2014   Procedure: COLOSTOMY;  Surgeon: Elsie Cable, MD;  Location: [facility] ORS;  Service: General;  Laterality: N/A;   ESOPHAGOGASTRODUODENOSCOPY (EGD) WITH PROPOFOL  N/A 11/04/2015   Procedure: ESOPHAGOGASTRODUODENOSCOPY (EGD) WITH PROPOFOL ;  Surgeon: Lamar ONEIDA Holmes, MD;  Location: [facility] ENDOSCOPY;  Service: Endoscopy;  Laterality: N/A;   Family Status  Relation Name Status   Mother  (Not Specified)   Father  (Not Specified)   Sister  (Not Specified)   Bruna Nyhan  (Not Specified)   PGM  (Not Specified)  No partnership data on file   Family History  Problem Relation Age of Onset   Hodgkin's lymphoma Mother    COPD Father    Fibroids Sister    Breast cancer Paternal Aunt 49   Parkinson's disease Paternal Grandmother    Social History   Socioeconomic History   Marital status: Married    Spouse name: Not on file   Number of children: Not on file   Years of education: Not on file   Highest education level: Not on file  Occupational History   Not on file  Tobacco Use   Smoking status: Never   Smokeless tobacco: Never  Vaping Use   Vaping status: Never Used  Substance and Sexual Activity   Alcohol use: No    Alcohol/week: 0.0 standard drinks of alcohol   Drug use: No   Sexual activity: Not on file  Other Topics Concern   Not on file  Social History Narrative   Not on file   Social Drivers of Health   Financial Resource Strain: Low Risk  (10/12/2023)   Overall Financial Resource Strain (CARDIA)    Difficulty of Paying Living Expenses: Not hard at  all  Food Insecurity: No Food Insecurity (10/12/2023)   Hunger Vital Sign    Worried About Running Out of Food in the Last Year: Never true    Ran Out of Food in the Last Year: Never true  Transportation Needs: No Transportation Needs (10/12/2023)   PRAPARE - Administrator, Civil Service (Medical): No    Lack of Transportation (Non-Medical): No  Physical Activity: Inactive (10/12/2023)   Exercise Vital Sign    Days of Exercise per Week: 0 days    Minutes of Exercise per Session: 0 min  Stress: No Stress Concern Present (10/12/2023)   [employer] of Occupational Health - Occupational Stress Questionnaire    Feeling of Stress: Not at  all  Social Connections: Moderately Isolated (10/12/2023)   Social Connection and Isolation Panel    Frequency of Communication with Friends and Family: Twice a week    Frequency of Social Gatherings with Friends and Family: Once a week    Attends Religious Services: Never    Database administrator or Organizations: No    Attends Engineer, structural: Never    Marital Status: Married     No Known Allergies  Immunization History  Administered Date(s) Administered   Fluad Quad(high Dose 65+) 12/23/2020, 12/29/2021   Influenza, High Dose Seasonal PF 12/20/2014, 12/31/2022   Influenza-Unspecified 01/02/2014, 01/02/2016, 12/21/2016, 01/07/2017, 12/03/2019   Moderna Covid-19 Fall Seasonal Vaccine 63yrs & older 12/31/2022   PFIZER Comirnaty(Gray Top)Covid-19 Tri-Sucrose Vaccine 05/05/2019, 05/16/2019, 12/11/2019, 06/17/2020   PNEUMOCOCCAL CONJUGATE-20 05/10/2023   Pfizer Covid-19 Vaccine Bivalent Booster 59yrs & up 12/17/2020, 07/08/2021   Pfizer(Comirnaty)Fall Seasonal Vaccine 12 years and older 12/12/2021   Pneumococcal Conjugate-13 10/26/2014   Pneumococcal Polysaccharide-23 10/26/2014    Health Maintenance  Topic Date Due   Medicare Annual Wellness (AWV)  Never done   Hepatitis C Screening  Never done   DTaP/Tdap/Td (1 -  Tdap) Never done   COVID-19 Vaccine (9 - Pfizer risk 2024-25 season) 07/01/2023   Zoster Vaccines- Shingrix (1 of 2) 01/12/2024 (Originally 04/28/1964)   INFLUENZA VACCINE  10/15/2023   Pneumococcal Vaccine: 50+ Years  Completed   DEXA SCAN  Completed   Hepatitis B Vaccines  Aged Out   HPV VACCINES  Aged Out   Meningococcal B Vaccine  Aged Out   Colonoscopy  Discontinued    Patient Care Team: Sharma Coyer, MD as PCP - General (Family Medicine) Cathlyn Seal, MD as Referring Physician (Dermatology)  Review of Systems      Objective    BP (!) 140/78 (BP Location: Right Arm, Patient Position: Sitting, Cuff Size: Normal)   Pulse 84   Resp 16   Ht 5' 2 (1.575 m)   Wt 107 lb 11.2 oz (48.9 kg)   SpO2 100%   BMI 19.70 kg/m      Depression Screen    10/12/2023    2:12 PM  PHQ 2/9 Scores  PHQ - 2 Score 0   No results found for any visits on 10/12/23.   Physical Exam Physical Exam GENERAL: Well appearing, no acute distress. CARDIOVASCULAR: Regular rate and rhythm, no murmurs. Bilateral radial pulses palpable. ABDOMEN: Abdomen flat, nondistended, soft, nontender. Midline surgical scar and right lower quadrant scar. EXTREMITIES: No lower extremity edema.      Assessment & Plan      Problem List Items Addressed This Visit       Cardiovascular and Mediastinum   Chronic migraine without aura without status migrainosus, not intractable   Relevant Medications   Butalbital-Aspirin -Caffeine (FIORINAL PO)     Digestive   Gastroesophageal reflux disease without esophagitis - Primary   Relevant Medications   omeprazole (PRILOSEC) 40 MG capsule   Chronic constipation     Musculoskeletal and Integument   History of basal cell cancer     Other   Personal history of other malignant neoplasm of skin   History of colon cancer    Assessment & Plan Colon cancer status post colectomy and reverse colostomy CEA levels are monitored annually.  Non-bleeding internal hemorrhoids noted on colonoscopy. Healthy mucosa with anastomosis in the transverse colon. 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 Laxative dependent, using Dulcolax to manage symptoms. Bowel movements occur every other day with current regimen. - Continue current Dulcolax regimen as needed  Gastroesophageal reflux disease (GERD) GERD managed with omeprazole 40 mg daily, which is effective in controlling symptoms. - Continue omeprazole 40 mg daily  Chronic headache Chronic headaches managed with Forane as needed. Only one dose taken since February, indicating effective control with current management. - Continue Forane as needed for headache management  Skin cancer status post Mohs surgery       Return in about 3 months (around 01/12/2024) for AWV.      Rockie Agent, MD  [facility] [phone] (phone) [phone] (fax)  [facility]

## 2023-10-21 ENCOUNTER — Ambulatory Visit: Admitting: Family Medicine

## 2023-10-21 DIAGNOSIS — K080 Exfoliation of teeth due to systemic causes: Secondary | ICD-10-CM | POA: Diagnosis not present

## 2024-01-12 ENCOUNTER — Ambulatory Visit (INDEPENDENT_AMBULATORY_CARE_PROVIDER_SITE_OTHER): Admitting: Family Medicine

## 2024-01-12 ENCOUNTER — Ambulatory Visit
Admission: RE | Admit: 2024-01-12 | Discharge: 2024-01-12 | Disposition: A | Discharge status: Home / Self Care | Attending: Family Medicine | Admitting: Family Medicine

## 2024-01-12 ENCOUNTER — Ambulatory Visit
Admission: RE | Admit: 2024-01-12 | Discharge: 2024-01-12 | Disposition: A | Discharge status: Home / Self Care | Source: Ambulatory Visit | Attending: Family Medicine | Admitting: Family Medicine

## 2024-01-12 ENCOUNTER — Encounter: Payer: Self-pay | Admitting: Family Medicine

## 2024-01-12 VITALS — BP 126/64 | HR 72 | Temp 98.2°F | Ht 62.0 in | Wt 107.7 lb

## 2024-01-12 DIAGNOSIS — R1013 Epigastric pain: Secondary | ICD-10-CM

## 2024-01-12 DIAGNOSIS — R911 Solitary pulmonary nodule: Secondary | ICD-10-CM

## 2024-01-12 DIAGNOSIS — R059 Cough, unspecified: Secondary | ICD-10-CM | POA: Diagnosis not present

## 2024-01-12 DIAGNOSIS — R0982 Postnasal drip: Secondary | ICD-10-CM

## 2024-01-12 MED ORDER — CETIRIZINE HCL 10 MG PO TABS
10.0000 mg | ORAL_TABLET | Freq: Every day | ORAL | 11 refills | Status: AC
Start: 2024-01-12 — End: ?

## 2024-01-12 MED ORDER — FLUTICASONE PROPIONATE 50 MCG/ACT NA SUSP: 2.0000 | Freq: Every day | NASAL | 6 refills | Status: AC

## 2024-01-12 NOTE — Patient Instructions (Signed)
 Please report to [facility] located at:  [address]  [place], [place] [address]  You do not need an appointment to have xrays completed.   Our office will follow up with  results once available.      To keep you healthy, please keep in mind the following health maintenance items that you are due for:   Health Maintenance Due  Topic Date Due   Medicare Annual Wellness (AWV)  Never done   DTaP/Tdap/Td (1 - Tdap) Never done     Best Wishes,   Dr. Lang

## 2024-01-12 NOTE — Progress Notes (Signed)
 Established patient visit   Patient: Stacey Miranda   DOB: Dec 22, 1945   78 y.o. Female  MRN: [id number] Visit Date: 01/12/2024  Today's healthcare provider: Rockie Agent, MD   Chief Complaint  Patient presents with   Medical Management of Chronic Issues    Patient is present for follow up appt, reports doing well overall.    Pain    Patient reports pain located under both breasts in upper abd area. States that she had CT 3 years ago that showed 3mm lung nodule in L Lower lobe    Subjective     HPI     Medical Management of Chronic Issues    Additional comments: Patient is present for follow up appt, reports doing well overall.         Pain    Additional comments: Patient reports pain located under both breasts in upper abd area. States that she had CT 3 years ago that showed 3mm lung nodule in L Lower lobe       Last edited by Cherry Chiquita HERO, CMA on 01/12/2024  2:28 PM.       Discussed the use of AI scribe software for clinical note transcription with the patient, who gave verbal consent to proceed.  History of Present Illness Stacey Miranda is a 78 year old female with colon cancer and chronic hypertension who presents with pain under her bilateral breast and upper abdominal area.  She experiences a 'tightness' under her bilateral breast and upper abdominal area, rated 1/10 in severity. The pain is present most of the day but is not bothersome enough to think about constantly. It does not change with eating or with the use of Prilosec. Occasionally, the pain radiates to her back but not consistently.  She has a history of a three millimeter pulmonary nodule in the left lower lobe, which was last seen on a CT scan in 2021. She is concerned about the nodule and its implications, given her history of colon cancer.  She experiences chronic postnasal drip, causing frequent throat clearing and coughing up yellow or cream-colored mucus, especially in the mornings.  This has been a persistent issue and is managed with Benadryl at night.  She has a history of chronic constipation, basal cell cancer, chronic migraines, and colon cancer of the splenic flexure. She is not currently on any medications for blood pressure, which was initially high but improved without medication.  She experiences shortness of breath more easily than in the past, which she attributes to aging. She has never smoked but had significant secondhand smoke exposure from her father, who was a heavy smoker. No difficulty swallowing or changes in appetite. No significant change in pain with Prilosec or Gas-X.     Past Medical History:  Diagnosis Date   Cancer [facility])    colon cancer june 2016   Cataract    Chronic headaches    Constipation    Gastric reflux    Glaucoma    Neuropathy    Personal history of chemotherapy    S/P chemotherapy, time since 4-12 weeks    colon cancer   Tinnitus     Medications: Outpatient Medications Prior to Visit  Medication Sig   acetaminophen  (TYLENOL ) 500 MG tablet Take 500 mg by mouth every 6 (six) hours as needed. Reported on 05/12/2015   bisacodyl  (DULCOLAX) 5 MG EC tablet Take 5 mg by mouth daily as needed for moderate constipation.   Butalbital-Aspirin -Caffeine (FIORINAL PO)  Take 40 mg by mouth as needed.   Cholecalciferol (VITAMIN D3) 1000 units CAPS Take 1,000 Units by mouth daily.   diphenhydrAMINE (BENADRYL) 25 mg capsule Take 25 mg by mouth at bedtime.   omeprazole (PRILOSEC) 40 MG capsule Take 40 mg by mouth daily.   No facility-administered medications prior to visit.    Review of Systems      Objective    BP 126/64 (BP Location: Left Arm, Patient Position: Sitting, Cuff Size: Normal) Comment: manual  Pulse 72   Temp 98.2 F (36.8 C) (Oral)   Ht 5' 2 (1.575 m)   Wt 107 lb 11.2 oz (48.9 kg)   SpO2 99%   BMI 19.70 kg/m   BP Readings from Last 3 Encounters:  01/12/24 126/64  10/12/23 (!) 140/78  11/04/15 105/64   Wt  Readings from Last 3 Encounters:  01/12/24 107 lb 11.2 oz (48.9 kg)  10/12/23 107 lb 11.2 oz (48.9 kg)  11/04/15 115 lb (52.2 kg)       Physical Exam  Physical Exam VITALS: BP- 126/64 CHEST: Clear to auscultation bilaterally, no wheezes, rhonchi, or crackles. CARDIOVASCULAR: Normal heart rate and rhythm, S1 and S2 normal without murmurs. ABDOMEN: Soft, non-distended, gastric tenderness and tenderness under bilateral inferior regions, no palpable masses, no organomegaly.    Results for orders placed or performed in visit on 01/12/24  HM COLONOSCOPY  Result Value Ref Range   HM Colonoscopy See Report (in chart) See Report (in chart), Patient Reported    Assessment & Plan     Problem List Items Addressed This Visit   None Visit Diagnoses       Nodule of lower lobe of left lung    -  Primary   Relevant Orders   DG Chest 2 View     Epigastric pain       Relevant Orders   CT ABDOMEN PELVIS W CONTRAST     Postnasal drip       Relevant Medications   fluticasone (FLONASE) 50 MCG/ACT nasal spray   cetirizine (ZYRTEC) 10 MG tablet       Assessment and Plan Assessment & Plan Epigastric and lower chest wall pain Chronic epigastric and lower chest wall pain, described as a tightness with a severity of 1/10. Pain is not associated with eating or relieved by Prilosec. Differential includes gastric reflux, ulcer, or referred pain from the lungs. No significant change with Prilosec, suggesting it may not be the primary cause. No associated back pain or changes in appetite. Imaging is necessary to rule out serious conditions. - Ordered chest x-ray to evaluate for referred pain from the lungs. - Ordered CT abdomen & pelvis to assess for pancreatic issues or other abdominal pathology. - Advised use of heat and Gas-X for symptomatic relief of potential gas-related discomfort.  Postnasal drip with chronic throat clearing and cough Chronic postnasal drip with throat clearing and cough,  particularly at night. Symptoms are persistent. No significant change with Mucinex. No smoking history, but significant secondhand smoke exposure. Symptoms suggestive of allergic rhinitis. - Start Flonase 50 mcg, two sprays in each nostril daily. - Start cetirizine 10 mg daily to manage congestion and postnasal drip.  Solitary pulmonary nodule, left lower lobe, stable Solitary pulmonary nodule in the left lower lobe, previously measured at 3 mm, unchanged over the years. No follow-up imaging was recommended previously due to stability. Concerns about potential metastasis from colon cancer history. Imaging is necessary to ensure stability and rule out progression. -  Ordered chest x-ray to assess current status of pulmonary nodule. - Ordered CT abdomen to evaluate for any potential metastasis from colon cancer.  History of colon cancer, splenic flexure Colon cancer at the splenic flexure with no current symptoms suggestive of recurrence. CEA levels previously monitored. Colonoscopy scheduled for January 2027. Regular monitoring is necessary to ensure no recurrence. - Ordered CEA testing in six months to monitor for recurrence. - Continue with scheduled colonoscopy in January 2027.     Return in about 6 months (around 07/12/2024).         Rockie Agent, MD  [facility] [phone] (phone) [phone] (fax)  [facility]

## 2024-01-16 ENCOUNTER — Ambulatory Visit: Payer: Self-pay | Admitting: Family Medicine

## 2024-01-16 DIAGNOSIS — D494 Neoplasm of unspecified behavior of bladder: Secondary | ICD-10-CM

## 2024-01-16 DIAGNOSIS — R1013 Epigastric pain: Secondary | ICD-10-CM

## 2024-01-18 ENCOUNTER — Ambulatory Visit
Admission: RE | Admit: 2024-01-18 | Discharge: 2024-01-18 | Disposition: A | Discharge status: Home / Self Care | Source: Ambulatory Visit | Attending: Family Medicine | Admitting: Family Medicine

## 2024-01-18 DIAGNOSIS — R1013 Epigastric pain: Secondary | ICD-10-CM | POA: Insufficient documentation

## 2024-01-18 DIAGNOSIS — N2889 Other specified disorders of kidney and ureter: Secondary | ICD-10-CM | POA: Diagnosis not present

## 2024-01-18 DIAGNOSIS — Z85038 Personal history of other malignant neoplasm of large intestine: Secondary | ICD-10-CM | POA: Diagnosis not present

## 2024-01-18 MED ORDER — IOHEXOL 300 MG/ML  SOLN
80.0000 mL | Freq: Once | INTRAMUSCULAR | Status: AC | PRN
  Administered 2024-01-18: 80 mL via INTRAVENOUS

## 2024-01-18 MED ORDER — BARIUM SULFATE 2 % PO SUSP
450.0000 mL | ORAL | Status: AC
  Administered 2024-01-18 (×2): 450 mL via ORAL

## 2024-01-24 ENCOUNTER — Encounter: Payer: Self-pay | Admitting: Family Medicine

## 2024-01-24 ENCOUNTER — Telehealth: Payer: Self-pay | Admitting: Family Medicine

## 2024-01-24 NOTE — Telephone Encounter (Signed)
 Copied from CRM [id number]. Topic: Clinical - Lab/Test Results >> Jan 24, 2024 12:22 PM Avram MATSU wrote: Reason for CRM: patient is calling about her CT results and would like a call before her providers leave. [phone] BENNIE)

## 2024-01-25 DIAGNOSIS — R1013 Epigastric pain: Secondary | ICD-10-CM | POA: Diagnosis not present

## 2024-01-25 DIAGNOSIS — D494 Neoplasm of unspecified behavior of bladder: Secondary | ICD-10-CM | POA: Diagnosis not present

## 2024-01-25 DIAGNOSIS — N3289 Other specified disorders of bladder: Secondary | ICD-10-CM | POA: Insufficient documentation

## 2024-01-25 NOTE — Assessment & Plan Note (Signed)
 2 cm right posterior lateral wall bladder mass (dx via CT, Sept 2025)   Reviewed her clinical history and recent CT imaging.  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  This requires cystoscopy with transurethral resection of the tumor under general anesthesia.  I explained the rationale and indications for this approach.  This would allow complete pathology-and if malignant, offers immediate and often curative treatment.  I briefly reviewed the possible outcomes with various bladder cancer pathologies-including the need for subsequent procedures, intravesical therapies, and rarely more aggressive surgery or systemic therapy. All questions answered today. I will see if we can add on case for this Friday.   - schedule in OR: cystoscopy, TURBT (2-5cm), intravesical Gemcitabine, possible Right URS, possible Right ureteral stent placement  - UA today

## 2024-01-25 NOTE — Progress Notes (Unsigned)
 01/26/24 10:57 AM   Stacey Miranda 1945/09/02 [id number]   HPI: 78 y.o. female here for initial evaluation of a bladder mass Recently worked up for chronic epigastric pain x1 year  CT A/P w/ con (01/23/24) -appears to have a 2 cm right posterior lateral wall bladder mass.  No right ureteral dilation or proximal hydronephrosis.  She does appear to have bilateral extrarenal pelvises.  Kidneys otherwise morphologically normal bilaterally.  No regional lymphadenopathy.  No hx of GU conditions, no prior GU surgeries Never smoker No Fhx of colon or bladder Ca Denies recent change in urinary symptoms, no GH, no hx of rUTI   Hx of colon Ca(dx 2016, s/p left hemicolectomy + 6 mo adj chemotherapy Otherwise healthy, no significant cardiopulm hx No blood thinners  Previously worked as an [employer], husband is retired OB/gyn      PMH: Past Medical History:  Diagnosis Date   Cancer (HCC)    colon cancer june 2016   Cataract    Chronic headaches    Constipation    Gastric reflux    Glaucoma    Neuropathy    Personal history of chemotherapy    S/P chemotherapy, time since 4-12 weeks    colon cancer   Tinnitus     Surgical History: Past Surgical History:  Procedure Laterality Date   COLON SURGERY     COLONOSCOPY WITH PROPOFOL  N/A 11/02/2014   Procedure: COLONOSCOPY WITH PROPOFOL ;  Surgeon: Donnice Vaughn Manes, MD;  Location: [facility] ENDOSCOPY;  Service: Endoscopy;  Laterality: N/A;   COLONOSCOPY WITH PROPOFOL  N/A 11/04/2015   Procedure: COLONOSCOPY WITH PROPOFOL ;  Surgeon: Lamar ONEIDA Holmes, MD;  Location: [facility] ENDOSCOPY;  Service: Endoscopy;  Laterality: N/A;   COLOSTOMY N/A 08/25/2014   Procedure: COLOSTOMY;  Surgeon: Elsie Cable, MD;  Location: [facility] ORS;  Service: General;  Laterality: N/A;   ESOPHAGOGASTRODUODENOSCOPY (EGD) WITH PROPOFOL  N/A 11/04/2015   Procedure: ESOPHAGOGASTRODUODENOSCOPY (EGD) WITH PROPOFOL ;  Surgeon: Lamar ONEIDA Holmes, MD;  Location: [facility] ENDOSCOPY;  Service:  Endoscopy;  Laterality: N/A;    Family History: Family History  Problem Relation Age of Onset   Hodgkin's lymphoma Mother    COPD Father    Fibroids Sister    Breast cancer Paternal Aunt 82   Parkinson's disease Paternal Grandmother     Social History:  reports that she has never smoked. She has never used smokeless tobacco. She reports that she does not drink alcohol and does not use drugs.      Physical Exam: BP (!) 178/91 (BP Location: Left Arm, Patient Position: Sitting, Cuff Size: Normal)   Pulse 96   Wt 110 lb (49.9 kg)   SpO2 99%   BMI 20.12 kg/m    Constitutional:  Alert and oriented, No acute distress. Cardiovascular: No clubbing, cyanosis, or edema. Respiratory: Normal respiratory effort, no increased work of breathing. GI: Nondistended Skin: No rashes, bruises or suspicious lesions. Neurologic: Grossly intact, no focal deficits, moving all 4 extremities. Psychiatric: Normal mood and affect.  Laboratory Data:  Latest Reference Range & Units 05/12/15 00:32  Creatinine 0.44 - 1.00 mg/dL 9.17     Pertinent Imaging: I have personally viewed and interpreted the CT A/P w/ con (01/23/24) -appears to have a 2 cm right posterior lateral wall bladder mass.  No right ureteral dilation or proximal hydronephrosis.  She does appear to have bilateral extrarenal pelvises.  Kidneys otherwise morphologically normal bilaterally.  No regional lymphadenopathy.    Assessment & Plan:    Bladder mass Assessment &  Plan: 2 cm right posterior lateral wall bladder mass (dx via CT, Sept 2025)   Reviewed her clinical history and recent CT imaging.  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  This requires cystoscopy with transurethral resection of the tumor under general anesthesia.  I explained the rationale and indications for this approach.  This would allow complete pathology-and if malignant, offers immediate and often  curative treatment.  I briefly reviewed the possible outcomes with various bladder cancer pathologies-including the need for subsequent procedures, intravesical therapies, and rarely more aggressive surgery or systemic therapy. All questions answered today. I will see if we can add on case for this Friday.   - schedule in OR: cystoscopy, TURBT (2-5cm), intravesical Gemcitabine, possible Right URS, possible Right ureteral stent placement  - UA today  Orders: -     Ambulatory Referral For Surgery Scheduling -     Urinalysis, Complete w Microscopic; Future      Penne Skye, MD 01/26/2024  [facility] [address] [place], [place] [address] [phone]

## 2024-01-25 NOTE — Telephone Encounter (Signed)
 Have already sent a mych message encounter to pcp regarding this- awaiting response

## 2024-01-26 ENCOUNTER — Other Ambulatory Visit: Payer: Self-pay | Admitting: Family Medicine

## 2024-01-26 ENCOUNTER — Telehealth: Payer: Self-pay

## 2024-01-26 ENCOUNTER — Ambulatory Visit: Admitting: Urology

## 2024-01-26 ENCOUNTER — Other Ambulatory Visit: Payer: Self-pay

## 2024-01-26 VITALS — BP 178/91 | HR 96 | Wt 110.0 lb

## 2024-01-26 DIAGNOSIS — D494 Neoplasm of unspecified behavior of bladder: Secondary | ICD-10-CM | POA: Diagnosis not present

## 2024-01-26 DIAGNOSIS — N3289 Other specified disorders of bladder: Secondary | ICD-10-CM

## 2024-01-26 LAB — CMP14+EGFR
ALT: 18 IU/L (ref 0–32)
AST: 28 IU/L (ref 0–40)
Albumin: 4.7 g/dL (ref 3.8–4.8)
Alkaline Phosphatase: 77 IU/L (ref 49–135)
BUN/Creatinine Ratio: 17 (ref 12–28)
BUN: 15 mg/dL (ref 8–27)
Bilirubin Total: 0.4 mg/dL (ref 0.0–1.2)
CO2: 22 mmol/L (ref 20–29)
Calcium: 9.6 mg/dL (ref 8.7–10.3)
Chloride: 103 mmol/L (ref 96–106)
Creatinine, Ser: 0.9 mg/dL (ref 0.57–1.00)
Globulin, Total: 2.5 g/dL (ref 1.5–4.5)
Glucose: 107 mg/dL — ABNORMAL HIGH (ref 70–99)
Potassium: 4.2 mmol/L (ref 3.5–5.2)
Sodium: 141 mmol/L (ref 134–144)
Total Protein: 7.2 g/dL (ref 6.0–8.5)
eGFR: 66 mL/min/1.73 (ref >59)

## 2024-01-26 LAB — CBC
Hematocrit: 44.4 % (ref 34.0–46.6)
Hemoglobin: 14.1 g/dL (ref 11.1–15.9)
MCH: 31.3 pg (ref 26.6–33.0)
MCHC: 31.8 g/dL (ref 31.5–35.7)
MCV: 99 fL — ABNORMAL HIGH (ref 79–97)
Platelets: 302 x10E3/uL (ref 150–450)
RBC: 4.5 x10E6/uL (ref 3.77–5.28)
RDW: 12.2 % (ref 11.7–15.4)
WBC: 10.4 x10E3/uL (ref 3.4–10.8)

## 2024-01-26 LAB — LIPASE: Lipase: 43 U/L (ref 14–85)

## 2024-01-26 LAB — TSH+FREE T4
Free T4: 1.02 ng/dL (ref 0.82–1.77)
TSH: 2.34 u[IU]/mL (ref 0.450–4.500)

## 2024-01-26 NOTE — Progress Notes (Signed)
 Surgical Physician Order Form [facility]  Dr. Georganne, MD  * Scheduling expectation : Next Available  *Length of Case: 30 min  *Clearance needed: no  *Anticoagulation Instructions: N/A  *Aspirin  Instructions: N/A  *Post-op visit Date/Instructions:  1-2 week with pathology review  *Diagnosis: Bladder Tumor  *Procedure: TURBT (<2cm), instravesical Gemcitabine, possible Right URS, possible Right ureteral stent placement   Additional orders: Gemcitabine 2000mg  bladder instillation  -Admit type: OUTpatient  -Anesthesia: Choice  -VTE Prophylaxis Standing Order SCD's       Other:   -Standing Lab Orders Per Anesthesia    Lab other: UA&Urine Culture  -Standing Test orders EKG/Chest x-ray per Anesthesia       Test other:   - Medications:  Ancef 2gm IV  -Other orders:  N/A

## 2024-01-26 NOTE — Progress Notes (Signed)
   [facility] Surgical Posting Form  Surgery Date: Date: 01/28/2024  Surgeon: Dr. Penne Skye, MD  Inpt ( No  )   Outpt (Yes)   Obs ( No  )   Diagnosis: D49.4 Bladder Tumor  -CPT: 47765, 51720, 52351, [id number]  Surgery: Transurethral Resection of Bladder Tumor with Intravesical Instillation of Gemcitabine  Stop Anticoagulations: No  Cardiac/Medical/Pulmonary Clearance needed: no  *Orders entered into EPIC  Date: 01/26/24   *Case booked in [place]  Date: 01/26/24  *Notified pt of Surgery: Date: 01/26/24  PRE-OP UA & CX: yes, obtained in clinic today 01/26/2024  *Placed into Prior Authorization Work [place] Date: 01/26/24  Assistant/laser/rep:No

## 2024-01-26 NOTE — Telephone Encounter (Signed)
 Per Dr. Georganne, Patient is to be scheduled for Transurethral Resection of Bladder Tumor with Intravesical Instillation of Gemcitabine   Mrs. Stacey Miranda was contacted and possible surgical dates were discussed, Friday November 14th, 2025 was agreed upon for surgery.   Patient was directed to call [phone] between 1-3pm the day before surgery to find out surgical arrival time.  Instructions were given not to eat or drink from midnight on the night before surgery and have a driver for the day of surgery. On the surgery day patient was instructed to enter through the Medical Mall entrance of [facility] report the Same Day Surgery desk.   Pre-Admit Testing will be in contact via phone to set up an interview with the anesthesia team to review your history and medications prior to surgery.   Reminder of this information was sent via MyChart to the patient.

## 2024-01-27 ENCOUNTER — Encounter: Payer: Self-pay | Admitting: Family Medicine

## 2024-01-27 ENCOUNTER — Other Ambulatory Visit: Payer: Self-pay

## 2024-01-27 ENCOUNTER — Encounter: Payer: Self-pay | Admitting: Urgent Care

## 2024-01-27 ENCOUNTER — Encounter
Admission: RE | Admit: 2024-01-27 | Discharge: 2024-01-27 | Disposition: A | Discharge status: Home / Self Care | Source: Ambulatory Visit | Attending: Urology | Admitting: Urology

## 2024-01-27 DIAGNOSIS — N3289 Other specified disorders of bladder: Secondary | ICD-10-CM

## 2024-01-27 DIAGNOSIS — D494 Neoplasm of unspecified behavior of bladder: Secondary | ICD-10-CM | POA: Insufficient documentation

## 2024-01-27 DIAGNOSIS — Z01810 Encounter for preprocedural cardiovascular examination: Secondary | ICD-10-CM | POA: Diagnosis not present

## 2024-01-27 DIAGNOSIS — R030 Elevated blood-pressure reading, without diagnosis of hypertension: Secondary | ICD-10-CM

## 2024-01-27 DIAGNOSIS — R9431 Abnormal electrocardiogram [ECG] [EKG]: Secondary | ICD-10-CM | POA: Diagnosis not present

## 2024-01-27 DIAGNOSIS — R079 Chest pain, unspecified: Secondary | ICD-10-CM

## 2024-01-27 HISTORY — DX: Elevated blood-pressure reading, without diagnosis of hypertension: R03.0

## 2024-01-27 HISTORY — DX: Atherosclerosis of aorta: I70.0

## 2024-01-27 HISTORY — DX: Gastro-esophageal reflux disease without esophagitis: K21.9

## 2024-01-27 HISTORY — DX: Other specified disorders of bladder: N32.89

## 2024-01-27 LAB — UA/M W/RFLX CULTURE, ROUTINE
Bilirubin, UA: NEGATIVE
Glucose, UA: NEGATIVE
Leukocytes,UA: NEGATIVE
Nitrite, UA: NEGATIVE
RBC, UA: NEGATIVE
Specific Gravity, UA: 1.011 (ref 1.005–1.030)
Urobilinogen, Ur: 0.2 mg/dL (ref 0.2–1.0)
pH, UA: 5.5 (ref 5.0–7.5)

## 2024-01-27 LAB — MICROSCOPIC EXAMINATION
Bacteria, UA: NONE SEEN
Casts: NONE SEEN /LPF
Epithelial Cells (non renal): NONE SEEN /HPF (ref 0–10)
WBC, UA: NONE SEEN /HPF (ref 0–5)

## 2024-01-27 LAB — SPECIMEN STATUS REPORT

## 2024-01-27 NOTE — Progress Notes (Addendum)
 Perioperative Services Pre-Admission/Anesthesia Testing    Date: 01/27/24  Name: Stacey Miranda DOB: 06/20/1945 MRN:   [id number]  Re: Plans for surgery; abnormal ECG and need for preoperative cardiovascular clearance  Planned Surgical Procedure(s):     Case: [id number] Date/Time: 01/28/24 1145   Procedures:      TURBT (TRANSURETHRAL RESECTION OF BLADDER TUMOR)     INSTILLATION, BLADDER - GEMCITABINE     URETEROSCOPY (Right)     CYSTOSCOPY, WITH STENT INSERTION (Right)   Anesthesia type: General   Diagnosis: Bladder tumor [D49.4]   Pre-op diagnosis: Bladder Tumor   Location: [facility] OR ROOM 10 / [facility] ORS FOR ANESTHESIA GROUP   Surgeons: Georganne Penne SAUNDERS, MD        Clinical Notes:  Patient is scheduled for the above procedure on 01/28/2024 with Dr. Penne Georganne, MD.  In preparation for her procedure, patient presented to the PAT clinic on 01/27/2024 for preoperative lab testing and ECG prior to her upcoming surgery tomorrow.  ECG reviewed by PAT APP.  Tracing showed normal sinus rhythm at a rate of 81, however there were inferior and anterolateral ST/T wave changes noted in leads II, III, aVF, and V3-V6. Changes concerning for ischemia. Unfortunately, I do not have any previous ECGs for comparison.  Patient did have a tracing performed in 2016 at [facility].  I have requested that tracing for comparison.  Reviewed most recent note from medicine that indicated patient complaining of pain beneath both of her breast.  Pain will intermittently radiate into the back.  I do not see mention of associated nausea, vomiting, diaphoresis, or shortness of breath.  Medicine does indicate that symptom is unchanged with PPI use.  Sensation is described as a tightness that she rated 1/10 in severity.  Reached out to medicine to discuss need for further evaluation prior to upcoming elective procedure with urology.  While procedure with urology is needed due to likely malignancy, we need to ensure that  patient is safe going into the procedure.  Reviewed the aforementioned with Dr. Marcine Angelica (medicine).  MD reviewed ECG and agrees with my assessment that patient needs to be seen by cardiology prior to surgery.  MD also added that patient's blood pressures have been elevated.  She is asking that consult be made on an urgent basis.  I advised her that I would take care of getting patient referred to cardiology and keep her apprised of the outcomes.  ECG:    Impression and Plan:  Stacey Miranda with plan bladder tumor resection with urology.  Patient with abnormal preoperative ECG.  Additionally, patient has been experiencing pain beneath her BILATERAL breasts and in her upper abdomen.  Pain will occasionally radiate into her back.  Changes on ECG concerning for ischemia, especially in the setting of non-specific chest/upper abdominal pain. Medicine is also advising that blood pressures have been elevated, which is also of added concern.   I reviewed my plan with medicine. MD agrees with my assessment and plans for cardiology consult. With her surgery being tomorrow, unfortunately this means that her procedure will have to be postponed.  I have discussed this with the surgeon's office.  I have contacted the patient to make her aware of the findings and need evaluation by the cardiology service line with plan for cardiovascular testing including TTE +/- MPI and/or CTA.  Urgent referral entered to [facility].  Above details included on referral.  Will reach out to the office to check on appointment  availability, as patient is anxious and eager to get her bladder tumor taken care of.  Awaiting return communication from cardiology at this time regarding appointment.  No further needs from the PAT department identified at this time.  ADDENDUM 01/27/2024 1355 PM: Patient has been scheduled for consult with cardiology next week on Thursday (02/03/2024); will be seeing Cadence Furth,  PA-C at 1400 PM.  Again, I anticipate at least a functional study and +/- other components of an ischemic workup, therefore I cannot advise when patient would be safe to reschedule for her procedure.  We are pending clearance by the cardiology service line at this point.    Will forward copy of note to Cadence for continuity of care purposes.  Urology and medicine have both been updated on plans as they stand at this point.  Will continue to follow-up as new advancements and information become available as we continue to develop this patient's surgical plan of care.  Dorise Pereyra, MSN, APRN, FNP-C, CEN [facility]  Perioperative Services Nurse Practitioner Phone: [phone] Fax: [phone] 01/27/24 12:09 PM  NOTE: This note has been prepared using Dragon dictation software. Despite my best ability to proofread, there is always the potential that unintentional transcriptional errors may still occur from this process.

## 2024-01-27 NOTE — Patient Instructions (Addendum)
 Your procedure is scheduled on: 01/28/24 - Friday Report to the Registration Desk on the 1st floor of the Medical Mall. To find out your arrival time, please call [phone] between 1PM - 3PM on: 01/27/24 - Thursday If your arrival time is 6:00 am, do not arrive before that time as the Medical Mall entrance doors do not open until 6:00 am.  REMEMBER: Instructions that are not followed completely may result in serious medical risk, up to and including death; or upon the discretion of your surgeon and anesthesiologist your surgery may need to be rescheduled.  Do not eat food or drink any liquids after midnight the night before surgery.  No gum chewing or hard candies.  One week prior to surgery: Stop Anti-inflammatories (NSAIDS) such as Advil, Aleve, Ibuprofen, Motrin, Naproxen, Naprosyn and Aspirin  based products such as Excedrin, Goody's Powder, BC Powder. You may continue to take Tylenol  if needed for pain up until the day of surgery.  Stop ANY OVER THE COUNTER supplements until after surgery. VITAMIN D3   ON THE DAY OF SURGERY ONLY TAKE THESE MEDICATIONS WITH SIPS OF WATER:  omeprazole (PRILOSEC)   No Alcohol for 24 hours before or after surgery.  No Smoking including e-cigarettes for 24 hours before surgery.  No chewable tobacco products for at least 6 hours before surgery.  No nicotine patches on the day of surgery.  Do not use any recreational drugs for at least a week (preferably 2 weeks) before your surgery.  Please be advised that the combination of cocaine and anesthesia may have negative outcomes, up to and including death. If you test positive for cocaine, your surgery will be cancelled.  On the morning of surgery brush your teeth with toothpaste and water, you may rinse your mouth with mouthwash if you wish. Do not swallow any toothpaste or mouthwash.  Do not wear jewelry, make-up, hairpins, clips or nail polish.  For welded (permanent) jewelry: bracelets,  anklets, waist bands, etc.  Please have this removed prior to surgery.  If it is not removed, there is a chance that hospital personnel will need to cut it off on the day of surgery.  Do not wear lotions, powders, or perfumes.   Do not shave body hair from the neck down 48 hours before surgery.  Contact lenses, hearing aids and dentures may not be worn into surgery.  Do not bring valuables to the hospital. [facility] is not responsible for any missing/lost belongings or valuables.   Notify your doctor if there is any change in your medical condition (cold, fever, infection).  Wear comfortable clothing (specific to your surgery type) to the hospital.  After surgery, you can help prevent lung complications by doing breathing exercises.  Take deep breaths and cough every 1-2 hours. Your doctor may order a device called an Incentive Spirometer to help you take deep breaths.  If you are being admitted to the hospital overnight, leave your suitcase in the car. After surgery it may be brought to your room.  In case of increased patient census, it may be necessary for you, the patient, to continue your postoperative care in the Same Day Surgery department.  If you are being discharged the day of surgery, you will not be allowed to drive home. You will need a responsible individual to drive you home and stay with you for 24 hours after surgery.   If you are taking public transportation, you will need to have a responsible individual with you.  Please  call the Pre-admissions Testing Dept. at [phone] if you have any questions about these instructions.  Surgery Visitation Policy:  Patients having surgery or a procedure may have two visitors.  Children under the age of 1 must have an adult with them who is not the patient.  Inpatient Visitation:    Visiting hours are 7 a.m. to 8 p.m. Up to four visitors are allowed at one time in a patient room. The visitors may rotate out with other  people during the day.  One visitor age 61 or older may stay with the patient overnight and must be in the room by 8 p.m.   Merchandiser, Retail to address health-related social needs:  [email or web address]

## 2024-01-28 ENCOUNTER — Ambulatory Visit: Payer: Self-pay | Admitting: Urgent Care

## 2024-01-28 ENCOUNTER — Encounter: Admission: RE | Payer: Self-pay | Source: Home / Self Care

## 2024-01-28 ENCOUNTER — Encounter: Payer: Self-pay | Admitting: Urgent Care

## 2024-01-28 ENCOUNTER — Encounter: Payer: Self-pay | Admitting: Family Medicine

## 2024-01-28 ENCOUNTER — Ambulatory Visit: Admission: RE | Admit: 2024-01-28 | Source: Home / Self Care | Admitting: Urology

## 2024-01-28 LAB — URINE CULTURE

## 2024-01-28 LAB — SPECIMEN STATUS REPORT

## 2024-01-28 SURGERY — TURBT (TRANSURETHRAL RESECTION OF BLADDER TUMOR)
Anesthesia: General | Laterality: Right

## 2024-01-31 ENCOUNTER — Encounter: Payer: Self-pay | Admitting: Urgent Care

## 2024-02-03 ENCOUNTER — Encounter: Payer: Self-pay | Admitting: Medical

## 2024-02-03 ENCOUNTER — Ambulatory Visit: Attending: Medical | Admitting: Medical

## 2024-02-03 VITALS — BP 150/70 | HR 89 | Ht 62.0 in | Wt 105.4 lb

## 2024-02-03 DIAGNOSIS — Z01818 Encounter for other preprocedural examination: Secondary | ICD-10-CM

## 2024-02-03 DIAGNOSIS — R030 Elevated blood-pressure reading, without diagnosis of hypertension: Secondary | ICD-10-CM | POA: Diagnosis not present

## 2024-02-03 DIAGNOSIS — R9431 Abnormal electrocardiogram [ECG] [EKG]: Secondary | ICD-10-CM | POA: Diagnosis not present

## 2024-02-03 DIAGNOSIS — R072 Precordial pain: Secondary | ICD-10-CM

## 2024-02-03 DIAGNOSIS — R079 Chest pain, unspecified: Secondary | ICD-10-CM | POA: Diagnosis not present

## 2024-02-03 MED ORDER — METOPROLOL TARTRATE 100 MG PO TABS: ORAL_TABLET | ORAL | 0 refills | Status: DC

## 2024-02-03 NOTE — Patient Instructions (Signed)
 Medication Instructions:  Your physician recommends that you continue on your current medications as directed. Please refer to the Current Medication list given to you today.    *If you need a refill on your cardiac medications before your next appointment, please call your pharmacy*  Lab Work: Your provider would like for you to have following labs drawn today BMP.     Testing/Procedures: Your physician has requested that you have an echocardiogram. Echocardiography is a painless test that uses sound waves to create images of your heart. It provides your doctor with information about the size and shape of your heart and how well your heart's chambers and valves are working.   You may receive an ultrasound enhancing agent through an IV if needed to better visualize your heart during the echo. This procedure takes approximately one hour.  There are no restrictions for this procedure.  This will take place at [address] [facility] [address], [place] [address]  Please note: We ask at that you not bring children with you during ultrasound (echo/ vascular) testing. Due to room size and safety concerns, children are not allowed in the ultrasound rooms during exams. Our front office staff cannot provide observation of children in our lobby area while testing is being conducted. An adult accompanying a patient to their appointment will only be allowed in the ultrasound room at the discretion of the ultrasound technician under special circumstances. We apologize for any inconvenience.     Your cardiac CT will be scheduled at one of the below locations:   [facility] [address] [place], [place] [address] [phone]  If scheduled at [facility] or [facility], please arrive 15 mins early for check-in and test prep.  There is spacious parking and easy access to the radiology department from the [facility] entrance. Please enter here and check-in with the desk attendant.   Please follow these instructions carefully (unless otherwise directed):  An IV will be required for this test and Nitroglycerin will be given.   On the Night Before the Test: Be sure to Drink plenty of water. Do not consume any caffeinated/decaffeinated beverages or chocolate 12 hours prior to your test. Do not take any antihistamines 12 hours prior to your test.   On the Day of the Test: Drink plenty of water until 1 hour prior to the test. Do not eat any food 1 hour prior to test. You may take your regular medications prior to the test.  Take metoprolol (Lopressor) 100 mg two hours prior to test. FEMALES- please wear underwire-free bra if available, avoid dresses & tight clothing      After the Test: Drink plenty of water. After receiving IV contrast, you may experience a mild flushed feeling. This is normal. On occasion, you may experience a mild rash up to 24 hours after the test. This is not dangerous. If this occurs, you can take Benadryl 25 mg, Zyrtec, Claritin, or Allegra and increase your fluid intake. (Patients taking Tikosyn should avoid Benadryl, and may take Zyrtec, Claritin, or Allegra) If you experience trouble breathing, this can be serious. If it is severe call 911 IMMEDIATELY. If it is mild, please call our office.  We will call to schedule your test 2-4 weeks out understanding that some insurance companies will need an authorization prior to the service being performed.   For more information and frequently asked questions, please visit our website :  [email or web address]  For non-scheduling related questions, please contact the cardiac imaging nurse navigator should you have any questions/concerns: Cardiac Imaging Nurse Navigators Direct Office Dial: [phone]   For scheduling needs, including cancellations and rescheduling, please call Brittany, [phone].     Follow-Up: At [facility], you and your health needs are our priority.  As part of our continuing mission to provide you with exceptional heart care, our providers are all part of one team.  This team includes your primary Cardiologist (physician) and Advanced Practice Providers or APPs (Physician Assistants and Nurse Practitioners) who all work together to provide you with the care you need, when you need it.  Your next appointment:   As needed  Provider:   Cadence Franchester, PA-C

## 2024-02-03 NOTE — Addendum Note (Signed)
 Addended by: DESIDERIO RUSSELL SAILOR on: 02/03/2024 02:41 PM   Modules accepted: Orders

## 2024-02-03 NOTE — Progress Notes (Signed)
  Cardiology Office Note   Date:  02/03/2024  ID:  Stacey Miranda 07/31/1945, MRN [id number] PCP: Sharma Coyer, MD  [facility] Providers Cardiologist:  New  History of Present Illness Stacey Miranda is a 78 y.o. female with h/o colon cancer, pulmonary nodule, basal cell cancer, chronic constipation who presents as a new patient for pre-op evaluation.  Family history negative for cardiac issues. No tobacco, alcohol, or drug history.   The patient presented for TURBT 01/18/24  for bladder mass. EKG was abnormal, so procedure was canceled and she was referred to cardiology.    She reports 1 year of epigastric pain. She has been on a PPI and pain is improved, but is still there. Pain is not worse on exertion. She has normal SOB on exertion. No lower leg edema, lightheadedness, dizziness, palpitations, heart racing. EKG shows NSR with min ST depression lateral leads with TWI lateral leads.  Studies Reviewed EKG Interpretation Date/Time:  Thursday February 03 2024 13:49:00 EST Ventricular Rate:  89 PR Interval:  122 QRS Duration:  74 QT Interval:  322 QTC Calculation: 391 R Axis:   27  Text Interpretation: Normal sinus rhythm ST & T wave abnormality, consider lateral ischemia When compared with ECG of 27-Jan-2024 11:28, Nonspecific T wave abnormality has replaced inverted T waves in Inferior leads Confirmed by Franchester, Winfred Redel ([id number]) on 02/03/2024 2:10:39 PM           Physical Exam VS:  BP (!) 150/70 (BP Location: Right Arm, Cuff Size: Normal)   Pulse 89   Ht 5' 2 (1.575 m)   Wt 105 lb 6 oz (47.8 kg)   SpO2 98%   BMI 19.27 kg/m        Wt Readings from Last 3 Encounters:  02/03/24 105 lb 6 oz (47.8 kg)  01/26/24 110 lb (49.9 kg)  01/12/24 107 lb 11.2 oz (48.9 kg)    GEN: Well nourished, well developed in no acute distress NECK: No JVD; No carotid bruits CARDIAC: RRR, no murmurs, rubs, gallops RESPIRATORY:  Clear to auscultation without rales, wheezing or  rhonchi  ABDOMEN: Soft, non-tender, non-distended EXTREMITIES:  No edema; No deformity   ASSESSMENT AND PLAN  Chest pain Abnormal EKG Patient was scheduled for urology procedure, but it was canceled the day of due to abnormal EKG. EKG showed minimal ST depression with T wave inversions in inferolateral leads.  No prior to compare to, however readouts from 2016 at [facility] note similar changes.  The patient reports chronic epigastric pain for which she takes a PPI.  She still has residual intermittent chest pain that is nonexertional.  No shortness of breath. Patient is overall fairly healthy remains active.  In preparation for surgery, I will order an echocardiogram and cardiac CTA.  Elevated blood pressure BP today is 150/68. BP readings from home are normal and mainly only elevated after exercise. Continue to monitor.   Pre-operative cardiac evaluation Patient is undergoing bladder mass workup at this time and is needing TURBT.  Plan as above for cardiac CTA and echocardiogram.  If this is normal, okay to proceed with surgery.  She is not on any blood thinners.  METs > 4.RCRI= 1.1% risk of MACE       Dispo: As needed  Signed, Duvan Mousel VEAR Franchester, PA-C

## 2024-02-04 ENCOUNTER — Ambulatory Visit: Admitting: Urology

## 2024-02-09 ENCOUNTER — Encounter: Payer: Self-pay | Admitting: Family Medicine

## 2024-02-17 ENCOUNTER — Ambulatory Visit: Attending: Medical

## 2024-02-17 DIAGNOSIS — R079 Chest pain, unspecified: Secondary | ICD-10-CM | POA: Diagnosis not present

## 2024-02-17 LAB — ECHOCARDIOGRAM COMPLETE
AR max vel: 1.53 cm2
AV Area VTI: 1.56 cm2
AV Area mean vel: 1.5 cm2
AV Mean grad: 5 mmHg
AV Peak grad: 8.4 mmHg
Ao pk vel: 1.45 m/s
Area-P 1/2: 3.46 cm2
S' Lateral: 2.4 cm

## 2024-02-18 ENCOUNTER — Telehealth: Payer: Self-pay | Admitting: Medical

## 2024-02-18 ENCOUNTER — Telehealth: Payer: Self-pay

## 2024-02-18 ENCOUNTER — Other Ambulatory Visit: Payer: Self-pay

## 2024-02-18 ENCOUNTER — Ambulatory Visit: Payer: Self-pay | Admitting: Medical

## 2024-02-18 DIAGNOSIS — D494 Neoplasm of unspecified behavior of bladder: Secondary | ICD-10-CM

## 2024-02-18 NOTE — Telephone Encounter (Signed)
 Pt reports that she already spoke to Cadence (just now) and now she's waiting for surgery to be scheduled

## 2024-02-18 NOTE — Telephone Encounter (Signed)
 Per Dr. Georganne, Patient is to be scheduled for Transurethral Resection of Bladder Tumor with Intravesical Instillation of Gemcitabine  Stacey Miranda was contacted and possible surgical dates were discussed, Tuesday December 23rd, 2025 was agreed upon for surgery.   Patient was instructed that Dr. Georganne will require them to provide a pre-op UA & CX prior to surgery. This was ordered and scheduled drop off appointment was made for 02/24/2024.    Patient was directed to call [phone] between 1-3pm the day before surgery to find out surgical arrival time.  Instructions were given not to eat or drink from midnight on the night before surgery and have a driver for the day of surgery. On the surgery day patient was instructed to enter through the Medical Mall entrance of [facility] report the Same Day Surgery desk.   Pre-Admit Testing will be in contact via phone to set up an interview with the anesthesia team to review your history and medications prior to surgery.   Reminder of this information was sent via MyChart to the patient.

## 2024-02-18 NOTE — Progress Notes (Signed)
    [facility] Surgical Posting Form   Surgery Date: Date: 03/07/2024   Surgeon: Dr. Penne Skye, MD   Inpt ( No  )   Outpt (Yes)   Obs ( No  )    Diagnosis: D49.4 Bladder Tumor   -CPT: 47765, 51720, 52351, [id number]   Surgery: Transurethral Resection of Bladder Tumor with Intravesical Instillation of Gemcitabine   Stop Anticoagulations: No   Cardiac/Medical/Pulmonary Clearance needed: no   *Orders entered into EPIC  Date: 02/18/2024   *Case booked in [place]  Date: 02/18/2024   *Notified pt of Surgery: Date: 02/18/2024   PRE-OP UA & CX: yes, will obtain in clinic on    *Placed into Prior Authorization Work [place] Date: 02/18/2024   Assistant/laser/rep:No

## 2024-02-18 NOTE — Telephone Encounter (Signed)
 Pt called requesting a c/b from a nurse to discuss a test and clearance from her Cancer Dr please advise

## 2024-02-18 NOTE — Telephone Encounter (Signed)
Please see updated encounter  

## 2024-02-23 ENCOUNTER — Encounter (HOSPITAL_COMMUNITY): Payer: Self-pay

## 2024-02-24 ENCOUNTER — Other Ambulatory Visit

## 2024-02-24 ENCOUNTER — Ambulatory Visit: Admission: RE | Admit: 2024-02-24 | Discharge: 2024-02-24 | Attending: Medical

## 2024-02-24 DIAGNOSIS — R072 Precordial pain: Secondary | ICD-10-CM | POA: Diagnosis present

## 2024-02-24 DIAGNOSIS — D494 Neoplasm of unspecified behavior of bladder: Secondary | ICD-10-CM | POA: Diagnosis not present

## 2024-02-24 MED ORDER — NITROGLYCERIN 0.4 MG SL SUBL
0.8000 mg | SUBLINGUAL_TABLET | Freq: Once | SUBLINGUAL | Status: AC
  Administered 2024-02-24: 0.8 mg via SUBLINGUAL
  Filled 2024-02-24: qty 25

## 2024-02-24 MED ORDER — IOHEXOL 350 MG/ML SOLN
100.0000 mL | Freq: Once | INTRAVENOUS | Status: AC | PRN
  Administered 2024-02-24: 100 mL via INTRAVENOUS

## 2024-02-24 MED ORDER — METOPROLOL TARTRATE 5 MG/5ML IV SOLN
10.0000 mg | Freq: Once | INTRAVENOUS | Status: DC | PRN
  Filled 2024-02-24: qty 10

## 2024-02-24 MED ORDER — DILTIAZEM HCL 25 MG/5ML IV SOLN
10.0000 mg | INTRAVENOUS | Status: DC | PRN
  Filled 2024-02-24: qty 5

## 2024-02-24 NOTE — Progress Notes (Signed)
 Patient tolerated CT well. Vitals stable. Encouraged to drink fluids throughout day.

## 2024-02-25 ENCOUNTER — Inpatient Hospital Stay: Admission: RE | Admit: 2024-02-25 | Discharge: 2024-02-25 | Attending: Urology

## 2024-02-25 LAB — MICROSCOPIC EXAMINATION: RBC, Urine: NONE SEEN /HPF (ref 0–2)

## 2024-02-25 LAB — URINALYSIS, COMPLETE
Bilirubin, UA: NEGATIVE
Glucose, UA: NEGATIVE
Ketones, UA: NEGATIVE
Nitrite, UA: NEGATIVE
Protein,UA: NEGATIVE
Specific Gravity, UA: <1.005 — ABNORMAL LOW (ref 1.005–1.030)
Urobilinogen, Ur: 0.2 mg/dL (ref 0.2–1.0)
pH, UA: 6 (ref 5.0–7.5)

## 2024-02-25 NOTE — Patient Instructions (Signed)
 Your procedure is scheduled on:03-07-24 Tuesday Report to the Registration Desk on the 1st floor of the Medical Mall.Then proceed to the 2nd floor Surgery Desk To find out your arrival time, please call [phone] between 1PM - 3PM on:03-06-24 Monday If your arrival time is 6:00 am, do not arrive before that time as the Medical Mall entrance doors do not open until 6:00 am.  REMEMBER: Instructions that are not followed completely may result in serious medical risk, up to and including death; or upon the discretion of your surgeon and anesthesiologist your surgery may need to be rescheduled.  Do not eat food OR drink liquids after midnight the night before surgery.  No gum chewing or hard candies.  One week prior to surgery:Last dose will be on 02-28-24 (Monday) Stop Anti-inflammatories (NSAIDS) such as Advil, Aleve, Ibuprofen, Motrin, Naproxen, Naprosyn and Aspirin  based products such as Excedrin, Goody's Powder, BC Powder. Stop ANY OVER THE COUNTER supplements until after surgery (Vitamin D3)  You may however, continue to take Tylenol  if needed for pain up until the day of surgery.  Continue taking all of your other prescription medications up until the day of surgery.  ON THE DAY OF SURGERY ONLY TAKE THESE MEDICATIONS WITH SIPS OF WATER: -omeprazole (PRILOSEC)   No Alcohol for 24 hours before or after surgery.  No Smoking including e-cigarettes for 24 hours before surgery.  No chewable tobacco products for at least 6 hours before surgery.  No nicotine patches on the day of surgery.  Do not use any recreational drugs for at least a week (preferably 2 weeks) before your surgery.  Please be advised that the combination of cocaine and anesthesia may have negative outcomes, up to and including death. If you test positive for cocaine, your surgery will be cancelled.  On the morning of surgery brush your teeth with toothpaste and water, you may rinse your mouth with mouthwash if you  wish. Do not swallow any toothpaste or mouthwash.  Do not wear jewelry, make-up, hairpins, clips or nail polish.  For welded (permanent) jewelry: bracelets, anklets, waist bands, etc.  Please have this removed prior to surgery.  If it is not removed, there is a chance that hospital personnel will need to cut it off on the day of surgery.  Do not wear lotions, powders, or perfumes.   Do not shave body hair from the neck down 48 hours before surgery.  Contact lenses, hearing aids and dentures may not be worn into surgery.  Do not bring valuables to the hospital. [facility] is not responsible for any missing/lost belongings or valuables.   Notify your doctor if there is any change in your medical condition (cold, fever, infection).  Wear comfortable clothing (specific to your surgery type) to the hospital.  After surgery, you can help prevent lung complications by doing breathing exercises.  Take deep breaths and cough every 1-2 hours. Your doctor may order a device called an Incentive Spirometer to help you take deep breaths. When coughing or sneezing, hold a pillow firmly against your incision with both hands. This is called splinting. Doing this helps protect your incision. It also decreases belly discomfort.  If you are being admitted to the hospital overnight, leave your suitcase in the car. After surgery it may be brought to your room.  In case of increased patient census, it may be necessary for you, the patient, to continue your postoperative care in the Same Day Surgery department.  If you are being discharged  the day of surgery, you will not be allowed to drive home. You will need a responsible individual to drive you home and stay with you for 24 hours after surgery.   If you are taking public transportation, you will need to have a responsible individual with you.  Please call the Pre-admissions Testing Dept. at [phone] if you have any questions about these  instructions.  Surgery Visitation Policy:  Patients having surgery or a procedure may have two visitors.  Children under the age of 57 must have an adult with them who is not the patient.   Merchandiser, Retail to address health-related social needs:  [email or web address]

## 2024-02-25 NOTE — Progress Notes (Signed)
 Perioperative Services Pre-Admission/Anesthesia Testing    Date: 02/25/2024  Name: Stacey Miranda DOB: 17-Jun-1945 MRN:   [id number]  Re: Plans for surgery; cardiac clearance  Planned Surgical Procedure(s):     Case: [id number] Date/Time: 03/07/24 1347   Procedures:      TURBT (TRANSURETHRAL RESECTION OF BLADDER TUMOR)     INSTILLATION, BLADDER - GEMCITABINE     URETEROSCOPY (Right)     CYSTOSCOPY, WITH STENT INSERTION (Right)   Anesthesia type: General   Diagnosis: Bladder tumor [D49.4]   Pre-op diagnosis: Bladder Tumor   Location: [facility] OR ROOM 10 / [facility] ORS FOR ANESTHESIA GROUP   Surgeons: Georganne Penne SAUNDERS, MD        Clinical Notes:  Patient is scheduled for the above procedure on 03/07/2024 with Dr. Penne SAUNDERS Georganne, MD.  Patient originally scheduled for surgery on 01/28/2024, however following her PAT clinic visit,  she was noted to have an abnormal ECG showing inferior and anterolateral ST and T wave abnormalities.  Patient complaining of pain beneath both of her breast that intermittently radiated to her back..  Reviewed ECG with medicine and the decision was made to refer patient to cardiology for preoperative evaluation and clearance.  Patient was seen in consult by cardiology on 01/27/2024; notes reviewed.  Patient complaining of epigastric pain x 1 year that had improved with the use of PPI therapy.  Patient with exertional dyspnea.  ECG showed normal sinus rhythm with minimal ST depression in the lateral leads with lateral T wave inversions.  In order to further evaluate patient's abnormal ECG, she was sent for TTE and cardiac CTA.  TTE performed on 02/17/2024 revealed a normal left ventricular systolic function with an EF of 55-60%. There were no regional wall motion abnormalities.  Left ventricular diastolic Doppler parameters were normal.  GLS -16.5% (normal range <-18%). Right ventricular size and function normal. RVSP = 32.6 mmHg.  There was trivial mitral and mild  tricuspid valve regurgitation.  All transvalvular gradients were noted to be normal providing no evidence of hemodynamically significant valvular stenosis. Aorta normal in size with no evidence of ectasia or aneurysmal dilatation.  Coronary CTA was performed on 02/24/2024 that demonstrated an Agatston coronary artery calcium score of 0. Study demonstrated normal coronary origin with RIGHT dominance.  Pulmonary artery and aorta noted to be of normal size/caliber with no evidence of ectasia.  Given the normal results of the aforementioned studies, patient has been cleared by cardiology to proceed.  METS >4.  RCRI = 1.1% risk of MACE. Based ACC/AHA guidelines, the patient's past medical history, and the amount of time since her last clinic visit, this patient would be at an overall ACCEPTABLE risk for the planned procedure. I have updated patient's PCP (Simmons-Robinson, MD) on the cardiology consult, results of the performed testing, and that clearance to proceed with surgery had been provided by the cardiology service line.    Impression and Plan:  Stacey Miranda has been referred for pre-anesthesia review and clearance prior to her undergoing the planned anesthetic and procedural courses. Available labs, pertinent testing, and imaging results were personally reviewed by me in preparation for upcoming operative/procedural course. [facility] medical record has been updated following extensive record review and patient interview with PAT staff.   This patient has been appropriately cleared by cardiology with an overall ACCEPTABLE risk of patient experiencing significant perioperative cardiovascular complications. here at [facility]. Based on clinical review performed today (02/25/2024), barring any significant acute  changes in the patient's overall condition, it is anticipated that she will be able to proceed with the planned surgical intervention. Any acute changes in  clinical condition may necessitate her procedure being postponed and/or cancelled. Patient will meet with anesthesia team (MD and/or CRNA) on the day of her procedure for preoperative evaluation/assessment. Questions regarding anesthetic course will be fielded at that time.   Pre-surgical instructions were reviewed with the patient during his PAT appointment, and questions were fielded to satisfaction by PAT clinical staff. She has been instructed on which medications that she will need to hold prior to surgery, as well as the ones that have been deemed safe/appropriate to take on the day of her procedure. As part of the general education provided by PAT, patient made aware both verbally and in writing, that she would need to abstain from the use of any illegal substances during her perioperative course. She was advised that failure to follow the provided instructions could necessitate case cancellation or result in serious perioperative complications up to and including death. Patient encouraged to contact PAT and/or her surgeon's office to discuss any questions or concerns that may arise prior to surgery; verbalized understanding.   Dorise Pereyra, MSN, APRN, FNP-C, CEN [facility]  Perioperative Services Nurse Practitioner Phone: [phone] Fax: [phone] 02/25/2024 9:52 AM  NOTE: This note has been prepared using Dragon dictation software. Despite my best ability to proofread, there is always the potential that unintentional transcriptional errors may still occur from this process.

## 2024-02-29 LAB — CULTURE, URINE COMPREHENSIVE

## 2024-03-07 ENCOUNTER — Ambulatory Visit
Admission: RE | Admit: 2024-03-07 | Discharge: 2024-03-07 | Disposition: A | Discharge status: Home / Self Care | Attending: Urology | Admitting: Urology

## 2024-03-07 ENCOUNTER — Encounter: Payer: Self-pay | Admitting: Urology

## 2024-03-07 ENCOUNTER — Ambulatory Visit: Payer: Self-pay | Admitting: Urgent Care

## 2024-03-07 ENCOUNTER — Encounter: Admission: RE | Disposition: A | Payer: Self-pay | Attending: Urology

## 2024-03-07 ENCOUNTER — Ambulatory Visit

## 2024-03-07 ENCOUNTER — Encounter: Payer: Self-pay | Admitting: Urgent Care

## 2024-03-07 DIAGNOSIS — Z9221 Personal history of antineoplastic chemotherapy: Secondary | ICD-10-CM | POA: Insufficient documentation

## 2024-03-07 DIAGNOSIS — D090 Carcinoma in situ of bladder: Secondary | ICD-10-CM | POA: Insufficient documentation

## 2024-03-07 DIAGNOSIS — R9431 Abnormal electrocardiogram [ECG] [EKG]: Secondary | ICD-10-CM | POA: Insufficient documentation

## 2024-03-07 DIAGNOSIS — Z9049 Acquired absence of other specified parts of digestive tract: Secondary | ICD-10-CM | POA: Diagnosis not present

## 2024-03-07 DIAGNOSIS — Z85038 Personal history of other malignant neoplasm of large intestine: Secondary | ICD-10-CM | POA: Insufficient documentation

## 2024-03-07 DIAGNOSIS — C679 Malignant neoplasm of bladder, unspecified: Secondary | ICD-10-CM

## 2024-03-07 DIAGNOSIS — D494 Neoplasm of unspecified behavior of bladder: Secondary | ICD-10-CM

## 2024-03-07 HISTORY — PX: TRANSURETHRAL RESECTION OF BLADDER TUMOR: SHX2575

## 2024-03-07 HISTORY — PX: BLADDER INSTILLATION: SHX6893

## 2024-03-07 SURGERY — TURBT (TRANSURETHRAL RESECTION OF BLADDER TUMOR)
Anesthesia: General | Site: Bladder

## 2024-03-07 MED ORDER — GEMCITABINE CHEMO FOR BLADDER INSTILLATION 2000 MG
2000.0000 mg | Freq: Once | INTRAVENOUS | Status: DC
  Filled 2024-03-07: qty 52.6

## 2024-03-07 MED ORDER — CEFAZOLIN SODIUM-DEXTROSE 2-4 GM/100ML-% IV SOLN
2.0000 g | INTRAVENOUS | Status: AC
  Administered 2024-03-07: 2 g via INTRAVENOUS

## 2024-03-07 MED ORDER — STERILE WATER FOR IRRIGATION IR SOLN
Status: DC | PRN
  Administered 2024-03-07: 500 mL

## 2024-03-07 MED ORDER — ACETAMINOPHEN 10 MG/ML IV SOLN
INTRAVENOUS | Status: DC | PRN
  Administered 2024-03-07: 1000 mg via INTRAVENOUS

## 2024-03-07 MED ORDER — SODIUM CHLORIDE 0.9 % IR SOLN
Status: DC | PRN
  Administered 2024-03-07: 3000 mL
  Administered 2024-03-07: 6000 mL

## 2024-03-07 MED ORDER — IOHEXOL 180 MG/ML  SOLN
INTRAMUSCULAR | Status: DC | PRN
  Administered 2024-03-07: 10 mL

## 2024-03-07 MED ORDER — PHENYLEPHRINE 80 MCG/ML (10ML) SYRINGE FOR IV PUSH (FOR BLOOD PRESSURE SUPPORT)
PREFILLED_SYRINGE | INTRAVENOUS | Status: DC | PRN
  Administered 2024-03-07: 80 ug via INTRAVENOUS

## 2024-03-07 MED ORDER — ACETAMINOPHEN 10 MG/ML IV SOLN
INTRAVENOUS | Status: AC
  Filled 2024-03-07: qty 100

## 2024-03-07 MED ORDER — PROPOFOL 10 MG/ML IV BOLUS
INTRAVENOUS | Status: DC | PRN
  Administered 2024-03-07: 140 mg via INTRAVENOUS

## 2024-03-07 MED ORDER — PHENAZOPYRIDINE HCL 200 MG PO TABS: 200.0000 mg | ORAL_TABLET | Freq: Three times a day (TID) | ORAL | 0 refills | Status: AC | PRN

## 2024-03-07 MED ORDER — LACTATED RINGERS IV SOLN: INTRAVENOUS | Status: DC

## 2024-03-07 MED ORDER — FENTANYL CITRATE (PF) 100 MCG/2ML IJ SOLN
INTRAMUSCULAR | Status: DC | PRN
  Administered 2024-03-07: 100 ug via INTRAVENOUS

## 2024-03-07 MED ORDER — ONDANSETRON HCL 4 MG/2ML IJ SOLN
INTRAMUSCULAR | Status: DC | PRN
  Administered 2024-03-07: 4 mg via INTRAVENOUS

## 2024-03-07 MED ORDER — CHLORHEXIDINE GLUCONATE 0.12 % MT SOLN
15.0000 mL | Freq: Once | OROMUCOSAL | Status: AC
  Administered 2024-03-07: 15 mL via OROMUCOSAL

## 2024-03-07 MED ORDER — GEMCITABINE CHEMO FOR BLADDER INSTILLATION 2000 MG
INTRAVENOUS | Status: DC | PRN
  Administered 2024-03-07: 2000 mg via INTRAVESICAL

## 2024-03-07 MED ORDER — ROCURONIUM BROMIDE 100 MG/10ML IV SOLN
INTRAVENOUS | Status: DC | PRN
  Administered 2024-03-07: 50 mg via INTRAVENOUS

## 2024-03-07 MED ORDER — OXYBUTYNIN CHLORIDE ER 5 MG PO TB24: 5.0000 mg | ORAL_TABLET | Freq: Every day | ORAL | 0 refills | Status: AC

## 2024-03-07 MED ORDER — FENTANYL CITRATE (PF) 100 MCG/2ML IJ SOLN
INTRAMUSCULAR | Status: AC
  Filled 2024-03-07: qty 2

## 2024-03-07 MED ORDER — ACETAMINOPHEN 10 MG/ML IV SOLN: 15.0000 mg/kg | Freq: Once | INTRAVENOUS | Status: DC | PRN

## 2024-03-07 MED ORDER — CEFAZOLIN SODIUM-DEXTROSE 2-4 GM/100ML-% IV SOLN
INTRAVENOUS | Status: AC
  Filled 2024-03-07: qty 100

## 2024-03-07 MED ORDER — ORAL CARE MOUTH RINSE: 15.0000 mL | Freq: Once | OROMUCOSAL | Status: AC

## 2024-03-07 MED ORDER — LIDOCAINE HCL (CARDIAC) PF 100 MG/5ML IV SOSY
PREFILLED_SYRINGE | INTRAVENOUS | Status: DC | PRN
  Administered 2024-03-07: 60 mg via INTRAVENOUS

## 2024-03-07 MED ORDER — ONDANSETRON HCL 4 MG/2ML IJ SOLN
INTRAMUSCULAR | Status: AC
  Filled 2024-03-07: qty 2

## 2024-03-07 MED ORDER — CHLORHEXIDINE GLUCONATE 0.12 % MT SOLN
OROMUCOSAL | Status: AC
  Filled 2024-03-07: qty 15

## 2024-03-07 MED ORDER — OXYCODONE HCL 5 MG/5ML PO SOLN: 5.0000 mg | Freq: Once | ORAL | Status: DC | PRN

## 2024-03-07 MED ORDER — FENTANYL CITRATE (PF) 100 MCG/2ML IJ SOLN: 25.0000 ug | INTRAMUSCULAR | Status: DC | PRN

## 2024-03-07 MED ORDER — ONDANSETRON HCL 4 MG/2ML IJ SOLN: 4.0000 mg | Freq: Once | INTRAMUSCULAR | Status: DC | PRN

## 2024-03-07 MED ORDER — OXYCODONE HCL 5 MG PO TABS: 5.0000 mg | ORAL_TABLET | Freq: Once | ORAL | Status: DC | PRN

## 2024-03-07 MED ORDER — PROPOFOL 10 MG/ML IV BOLUS
INTRAVENOUS | Status: AC
  Filled 2024-03-07: qty 20

## 2024-03-07 MED ORDER — SUGAMMADEX SODIUM 200 MG/2ML IV SOLN
INTRAVENOUS | Status: DC | PRN
  Administered 2024-03-07: 200 mg via INTRAVENOUS

## 2024-03-07 MED ORDER — DEXAMETHASONE SOD PHOSPHATE PF 10 MG/ML IJ SOLN
INTRAMUSCULAR | Status: DC | PRN
  Administered 2024-03-07: 10 mg via INTRAVENOUS

## 2024-03-07 MED ORDER — EPHEDRINE 5 MG/ML INJ
INTRAVENOUS | Status: AC
  Filled 2024-03-07: qty 5

## 2024-03-07 SURGICAL SUPPLY — 26 items
BAG DRAIN SIEMENS DORNER NS (MISCELLANEOUS) ×1 IMPLANT
BAG URINE DRAIN 2000ML AR STRL (UROLOGICAL SUPPLIES) ×1 IMPLANT
BRUSH SCRUB EZ 4% CHG (MISCELLANEOUS) ×1 IMPLANT
CATH FOLEY 2WAY SIL 18X30 (CATHETERS) IMPLANT
CATH URETL OPEN 5X70 (CATHETERS) ×1 IMPLANT
DRAPE UTILITY 15X26 TOWEL STRL (DRAPES) ×1 IMPLANT
DRSG TELFA 3X4 N-ADH STERILE (GAUZE/BANDAGES/DRESSINGS) ×1 IMPLANT
ELECTRODE LOOP 22F BIPOLAR SML (ELECTROSURGICAL) IMPLANT
ELECTRODE REM PT RTRN 9FT ADLT (ELECTROSURGICAL) IMPLANT
GLOVE BIOGEL PI IND STRL 7.0 (GLOVE) ×1 IMPLANT
GOWN STRL REUS W/ TWL LRG LVL3 (GOWN DISPOSABLE) ×2 IMPLANT
GUIDEWIRE STR DUAL SENSOR (WIRE) ×1 IMPLANT
KIT TURNOVER CYSTO (KITS) ×1 IMPLANT
LOOP CUT BIPOLAR 24F LRG (ELECTROSURGICAL) IMPLANT
NDL SAFETY ECLIP 18X1.5 (MISCELLANEOUS) ×1 IMPLANT
PACK CYSTO AR (MISCELLANEOUS) ×1 IMPLANT
PAD ARMBOARD POSITIONER FOAM (MISCELLANEOUS) ×1 IMPLANT
SET CYSTO IRRIGATION (SET/KITS/TRAYS/PACK) ×1 IMPLANT
SET IRRIG Y-TYPE CYSTO (SET/KITS/TRAYS/PACK) ×1 IMPLANT
SOL .9 NS 3000ML IRR UROMATIC (IV SOLUTION) ×2 IMPLANT
SOLN STERILE WATER 500 ML (IV SOLUTION) ×1 IMPLANT
SOLN STERILE WATER BTL 1000 ML (IV SOLUTION) ×1 IMPLANT
STENT URET 6FRX24 CONTOUR (STENTS) IMPLANT
STENT URET 6FRX26 CONTOUR (STENTS) IMPLANT
SURGILUBE 2OZ TUBE FLIPTOP (MISCELLANEOUS) ×1 IMPLANT
SYRINGE TOOMEY IRRIG 70ML (MISCELLANEOUS) ×1 IMPLANT

## 2024-03-07 NOTE — Transfer of Care (Signed)
 Immediate Anesthesia Transfer of Care Note  Patient: Stacey Miranda  Procedure(s) Performed: TURBT (TRANSURETHRAL RESECTION OF BLADDER TUMOR) (Bladder) INSTILLATION, BLADDER (Bladder)  Patient Location: PACU  Anesthesia Type:General  Level of Consciousness: sedated  Airway & Oxygen Therapy: Patient Spontanous Breathing  Post-op Assessment: Report given to RN  Post vital signs: stable  Last Vitals:  Vitals Value Taken Time  BP 141/67 03/07/24 14:13  Temp    Pulse 59 03/07/24 14:14  Resp 13 03/07/24 14:14  SpO2 100 % 03/07/24 14:14  Vitals shown include unfiled device data.  Last Pain:  Vitals:   03/07/24 1231  TempSrc: Temporal         Complications: No notable events documented.

## 2024-03-07 NOTE — H&P (Signed)
 "     HPI:  Stacey Miranda is a 78 y.o. year old  2 cm right posterior lateral wall bladder mass (dx via CT, Sept 2025)   CT A/P w/ con (01/23/24) -appears to have a 2 cm right posterior lateral wall bladder mass.  No right ureteral dilation or proximal hydronephrosis.  She does appear to have bilateral extrarenal pelvises.  Kidneys otherwise morphologically normal bilaterally.  No regional lymphadenopathy.   No hx of GU conditions, no prior GU surgeries Never smoker No Fhx of colon or bladder Ca Denies recent change in urinary symptoms, no GH, no hx of rUTI   Hx of colon Ca(dx 2016, s/p left hemicolectomy + 6 mo adj chemotherapy Otherwise healthy, no significant cardiopulm hx No blood thinners   Previously worked as an [employer], husband is retired OB/gyn       PMH: Past Medical History:  Diagnosis Date   Aortic atherosclerosis    Bladder mass    Cataract    Chronic headaches    Colon cancer (HCC) 08/2014   Constipation    Elevated BP without diagnosis of hypertension    Gastric reflux    GERD (gastroesophageal reflux disease)    Glaucoma    Neuropathy    Personal history of chemotherapy    Tinnitus     Surgical History: Past Surgical History:  Procedure Laterality Date   COLON SURGERY     COLONOSCOPY WITH PROPOFOL  N/A 11/02/2014   Procedure: COLONOSCOPY WITH PROPOFOL ;  Surgeon: Donnice Vaughn Manes, MD;  Location: [facility] ENDOSCOPY;  Service: Endoscopy;  Laterality: N/A;   COLONOSCOPY WITH PROPOFOL  N/A 11/04/2015   Procedure: COLONOSCOPY WITH PROPOFOL ;  Surgeon: Lamar ONEIDA Holmes, MD;  Location: [facility] ENDOSCOPY;  Service: Endoscopy;  Laterality: N/A;   COLOSTOMY N/A 08/25/2014   Procedure: COLOSTOMY;  Surgeon: Elsie Cable, MD;  Location: [facility] ORS;  Service: General;  Laterality: N/A;   colostomy takedown 2016     ESOPHAGOGASTRODUODENOSCOPY (EGD) WITH PROPOFOL  N/A 11/04/2015   Procedure: ESOPHAGOGASTRODUODENOSCOPY (EGD) WITH PROPOFOL ;  Surgeon: Lamar ONEIDA Holmes, MD;   Location[facility] ENDOSCOPY;  Service: Endoscopy;  Laterality: N/A;    Home Medications:  Allergies as of 03/07/2024   No Known Allergies      Medication List      Notice   Cannot display discharge medications because the patient has not yet been admitted.     Allergies: Allergies[1]  Family History: Family History  Problem Relation Age of Onset   Hodgkin's lymphoma Mother    COPD Father    Fibroids Sister    Breast cancer Paternal Aunt 52   Parkinson's disease Paternal Grandmother     Social History:  reports that she has never smoked. She has never used smokeless tobacco. She reports that she does not drink alcohol and does not use drugs.  ROS: Negative aside from those stated in the HPI.  Physical Exam: There were no vitals taken for this visit.   General: no acute distress, alert/oriented, conversational  HEENT: equal nondilated pupils CV: regular rate Lung: unlabored breathing, regular rate and rhythm  Abd: nondistended, nontender with palpation, no palpable masses  MSK: moving all extremities without issue, normal observed motor function  Laboratory Data:  Latest Reference Range & Units 01/25/24 13:38  Creatinine 0.57 - 1.00 mg/dL 9.09    Latest Reference Range & Units 01/25/24 13:38  Hemoglobin 11.1 - 15.9 g/dL 85.8       Assessment & Plan:   2 cm right posterior lateral wall  bladder mass (dx via CT, Sept 2025)   Plan to proceed to OR today for: TURBT + intravesical gemcitabine  Consent to be signed in pre-op Outpatient procedure, expect dc home  Penne JONELLE Skye, MD  [facility] [address] [place], [place] [address] [phone]      [1] No Known Allergies  "

## 2024-03-07 NOTE — Discharge Instructions (Signed)
 Transurethral Resection of Bladder Tumor (TURBT) or Bladder Biopsy   Definition:  Transurethral Resection of the Bladder Tumor is a surgical procedure used to diagnose and remove tumors within the bladder. TURBT is the most common treatment for early stage bladder cancer.  General instructions:     Your recent bladder surgery requires very little post hospital care but some definite precautions.  Despite the fact that no skin incisions were used, the area around the bladder incisions are raw and covered with scabs to promote healing and prevent bleeding. Certain precautions are needed to insure that the scabs are not disturbed over the next 2-4 weeks while the healing proceeds.  Because the raw surface inside your bladder and the irritating effects of urine you may expect frequency of urination and/or urgency (a stronger desire to urinate) and perhaps even getting up at night more often. This will usually resolve or improve slowly over the healing period. You may see some blood in your urine over the first 6 weeks. Do not be alarmed, even if the urine was clear for a while. Get off your feet and drink lots of fluids until clearing occurs. If you start to pass clots or don't improve call us .  Diet:  You may return to your normal diet immediately. Because of the raw surface of your bladder, alcohol, spicy foods, foods high in acid and drinks with caffeine may cause irritation or frequency and should be used in moderation. To keep your urine flowing freely and avoid constipation, drink plenty of fluids during the day (8-10 glasses). Tip: Avoid cranberry juice because it is very acidic.  Activity:  Your physical activity doesn't need to be restricted. However, if you are very active, you may see some blood in the urine. We suggest that you reduce your activity under the circumstances until the bleeding has stopped.  Bowels:  It is important to keep your bowels regular during the postoperative  period. Straining with bowel movements can cause bleeding. A bowel movement every other day is reasonable. Use a mild laxative if needed, such as milk of magnesia 2-3 tablespoons, or 2 Dulcolax tablets. Call if you continue to have problems. If you had been taking narcotics for pain, before, during or after your surgery, you may be constipated. Take a laxative if necessary.   Medication:  You should resume your pre-surgery medications unless told not to. In addition you may be given an antibiotic to prevent or treat infection. Antibiotics are not always necessary. All medication should be taken as prescribed until the bottles are finished unless you are having an unusual reaction to one of the drugs.  Follow up to be scheduled in 7-10 days in the Urology clinic with Dr. Georganne Pack [facility] [address], [address] [place], [place] [address] [phone]

## 2024-03-07 NOTE — Anesthesia Postprocedure Evaluation (Signed)
"   Anesthesia Post Note  Patient: Stacey Miranda  Procedure(s) Performed: TURBT (TRANSURETHRAL RESECTION OF BLADDER TUMOR) (Bladder) INSTILLATION, BLADDER (Bladder)  Patient location during evaluation: PACU Anesthesia Type: General Level of consciousness: awake and alert, oriented and patient cooperative Pain management: pain level controlled Vital Signs Assessment: post-procedure vital signs reviewed and stable Respiratory status: spontaneous breathing, nonlabored ventilation and respiratory function stable Cardiovascular status: blood pressure returned to baseline and stable Postop Assessment: adequate PO intake Anesthetic complications: no   No notable events documented.   Last Vitals:  Vitals:   03/07/24 1430 03/07/24 1445  BP: (!) 127/114 (!) 141/59  Pulse: 71 71  Resp: 17 12  Temp:    SpO2: 100% 100%    Last Pain:  Vitals:   03/07/24 1445  TempSrc:   PainSc: 0-No pain                 Alfonso Ruths      "

## 2024-03-07 NOTE — Op Note (Signed)
 Date of procedure: 03/07/2024  Preoperative diagnosis:  Bladder mass   Postoperative diagnosis:  Bladder tumor (3 cm)   Procedure: TRUBT (2-5 cm) Instillation of intravesical Gemcitabine  Right retrograde pyelogram  Surgeon: Penne Skye, MD  Anesthesia: General  Complications: None  Intraoperative findings:  ~2-3cm papillary stalked bladder tumor along Right trigone, lateral to Right UO. Fully resected Right distal RPG revealed normal Right ureter and upper tract, no filling defects, no contrast extravasation distally  Intravesical 2g Gemcitabine  instilled  EBL: <2 cc  Specimens:  Bladder tumor, superficial Bladder tumor, deep  Drains: 73F 2-way Foley catheter w/ 10cc water  in balloon  Indication: Stacey Miranda is a 78 y.o. patient with:  2 cm right posterior lateral wall bladder mass (dx via CT, Sept 2025)   After reviewing the management options for treatment, they elected to proceed with the above surgical procedure(s). We have discussed the potential benefits and risks of the procedure, side effects of the proposed treatment, the likelihood of the patient achieving the goals of the procedure, and any potential problems that might occur during the procedure or recuperation. Informed consent has been obtained.  Description of procedure:  The patient was taken to the operating room and general anesthesia was induced. SCDs were placed for DVT prophylaxis. The patient was placed in the dorsal lithotomy position, prepped and draped in the usual sterile fashion, and preoperative antibiotics were administered. A preoperative time-out was performed.   We began with rigid cystoscopy and a complete inspection of the urethra and bladder was performed.  Bilateral ureteral orifices were noted in orthotopic location.  We transitioned to a 37F resectoscope with Bipolar loop electrocautery.  A ~2-3 cm papillary stalked bladder tumor was visualized at the Right trigone/base, lateral to  the Right UO. Right UO was within < 1cm margin of tumor base. The tumor was fully resected with bipolar loop. Two specimens were sent: 1) superficial bulk tumor and 2) deep margin.   We then performed a distal Right retrograde pyelogram to ensure uninvolvement of Right intramural ureter, and exclude any filling defects or concerning disease. Above findings noted, no concerns with RPG.   Hemostasis was achieved with careful electrocautery along the periphery of our resection beds.   At this point all instruments were removed. We placed a 73F 2-way catheter, followed by 2g intravesical Gemcitabine  at 1406. The catheter lumen was clamped.  Patient tolerated well, was extubated and transferred to the PACU in excellent condition.  Disposition: Stable to PACU   Plan: Post-op Gemcitabine :  2g intravesical Gemcitabine  was instilled intraoperatively at 1406; dwell for 1 hour in PACU then drain to chemo-designated container. Nursing: use chemo PPE, closed system, dispose urine/catheters/linens as hazardous waste; instruct patient to void sitting, double flush, wash after urination 24 hrs  - Follow up in 7-10 days for pathology review in clinic  Penne Skye, MD

## 2024-03-07 NOTE — Anesthesia Preprocedure Evaluation (Addendum)
 "                                  Anesthesia Evaluation  Patient identified by MRN, date of birth, ID band Patient awake    Reviewed: Allergy & Precautions, NPO status , Patient's Chart, lab work & pertinent test results  History of Anesthesia Complications Negative for: history of anesthetic complications  Airway Mallampati: I   Neck ROM: Full    Dental no notable dental hx.    Pulmonary neg pulmonary ROS   Pulmonary exam normal breath sounds clear to auscultation       Cardiovascular Exercise Tolerance: Good Normal cardiovascular exam Rhythm:Regular Rate:Normal  Echo 02/17/24:  Normal left ventricular systolic function with an EF of 55-60%. There were no regional wall motion abnormalities.  Left ventricular diastolic Doppler parameters were normal.  GLS -16.5% (normal range <-18%). Right ventricular size and function normal. RVSP = 32.6 mmHg.  There was trivial mitral and mild tricuspid valve regurgitation.  All transvalvular gradients were noted to be normal providing no evidence of hemodynamically significant valvular stenosis. Aorta normal in size with no evidence of ectasia or aneurysmal dilatation.  CT coronary 02/24/24:  1. Coronary calcium score of 0. 2. Normal coronary origin with right dominance. 3. No evidence of CAD. 4. CAD-RADS 0. Consider non-atherosclerotic causes of chest pain.    Neuro/Psych  Headaches  Neuromuscular disease (neuropathy)    GI/Hepatic ,GERD  ,,Colon CA 2016   Endo/Other  negative endocrine ROS    Renal/GU      Musculoskeletal   Abdominal   Peds  Hematology negative hematology ROS (+)   Anesthesia Other Findings Reviewed and agree with Dorise Boor pre-anesthesia clinical review note.    Cardiology note 02/03/24:  Chest pain Abnormal EKG Patient was scheduled for urology procedure, but it was canceled the day of due to abnormal EKG. EKG showed minimal ST depression with T wave inversions in inferolateral leads.   No prior to compare to, however readouts from 2016 at [facility] note similar changes.  The patient reports chronic epigastric pain for which she takes a PPI.  She still has residual intermittent chest pain that is nonexertional.  No shortness of breath. Patient is overall fairly healthy remains active.  In preparation for surgery, I will order an echocardiogram and cardiac CTA.   Elevated blood pressure BP today is 150/68. BP readings from home are normal and mainly only elevated after exercise. Continue to monitor.    Pre-operative cardiac evaluation Patient is undergoing bladder mass workup at this time and is needing TURBT.  Plan as above for cardiac CTA and echocardiogram.  If this is normal, okay to proceed with surgery.  She is not on any blood thinners.  METs > 4.RCRI= 1.1% risk of MACE   Reproductive/Obstetrics                              Anesthesia Physical Anesthesia Plan  ASA: 2  Anesthesia Plan: General   Post-op Pain Management:    Induction: Intravenous  PONV Risk Score and Plan: 3 and Ondansetron , Dexamethasone  and Treatment may vary due to age or medical condition  Airway Management Planned: Oral ETT  Additional Equipment:   Intra-op Plan:   Post-operative Plan: Extubation in OR  Informed Consent: I have reviewed the patients History and Physical, chart, labs and discussed the procedure including the  risks, benefits and alternatives for the proposed anesthesia with the patient or authorized representative who has indicated his/her understanding and acceptance.     Dental advisory given  Plan Discussed with: CRNA  Anesthesia Plan Comments: (Patient consented for risks of anesthesia including but not limited to:  - adverse reactions to medications - damage to eyes, teeth, lips or other oral mucosa - nerve damage due to positioning  - sore throat or hoarseness - damage to heart, brain, nerves, lungs, other parts of body or loss of  life  Informed patient about role of CRNA in peri- and intra-operative care.  Patient voiced understanding.)         Anesthesia Quick Evaluation  "

## 2024-03-07 NOTE — Anesthesia Procedure Notes (Signed)
 Procedure Name: Intubation Date/Time: 03/07/2024 1:34 PM  Performed by: Sarit Sparano D, CRNAPre-anesthesia Checklist: Patient identified, Patient being monitored, Timeout performed, Emergency Drugs available and Suction available Patient Re-evaluated:Patient Re-evaluated prior to induction Oxygen Delivery Method: Circle system utilized Preoxygenation: Pre-oxygenation with 100% oxygen Induction Type: IV induction Ventilation: Mask ventilation without difficulty Laryngoscope Size: Miller and 2 Grade View: Grade I Tube type: Oral Tube size: 7.0 mm Number of attempts: 1 Airway Equipment and Method: Stylet Placement Confirmation: ETT inserted through vocal cords under direct vision, positive ETCO2 and breath sounds checked- equal and bilateral Secured at: 21 cm Tube secured with: Tape Dental Injury: Teeth and Oropharynx as per pre-operative assessment

## 2024-03-08 ENCOUNTER — Encounter: Payer: Self-pay | Admitting: Urology

## 2024-03-10 LAB — SURGICAL PATHOLOGY

## 2024-03-17 NOTE — Progress Notes (Unsigned)
 "  03/22/2024 6:49 AM   Stacey Miranda [birth year] [id number]  Reason for visit: Follow up bladder Ca   HPI: 79 y.o. female, follow up with me today  S/p TURBT 03/07/24)   - path = ~3 cm HGTa, muscle present uninvolved  Prior HPI: CT A/P w/ con (01/23/24) -appears to have a 2 cm right posterior lateral wall bladder mass.  No right ureteral dilation or proximal hydronephrosis.  She does appear to have bilateral extrarenal pelvises.  Kidneys otherwise morphologically normal bilaterally.  No regional lymphadenopathy.   No hx of GU conditions, no prior GU surgeries Never smoker No Fhx of colon or bladder Ca Denies recent change in urinary symptoms, no GH, no hx of rUTI   Hx of colon Ca(dx 2016, s/p left hemicolectomy + 6 mo adj chemotherapy Otherwise healthy, no significant cardiopulm hx No blood thinners   Previously worked as an [employer], husband is retired Estate Agent    Physical Exam: There were no vitals taken for this visit.   Constitutional:  Alert and oriented, No acute distress.  Laboratory Data: REPORT OF SURGICAL PATHOLOGY   Accession #: [id number] Patient Name: Stacey Miranda Visit # : [id number]  MRN: [id number] Physician: Georganne Riis DOB/Age Sep 29, 1945 (Age: 58) Gender: F Collected Date: 03/07/2024 Received Date: 03/08/2024  FINAL DIAGNOSIS       1. 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      - MUSCULARIS PROPRIA IS NOT PRESENT FOR EVALUATION.       2. Bladder, transurethral resection, bladder tumor deep :      - NON-INVASIVE HIGH GRADE UROTHELIAL CARCINOMA (WHO/ISUP).      - MUSCULARIS PROPRIA IS PRESENT AND NOT INVOLVED.       Diagnosis Note : Dr.Viren Lebeau was notified on 03/10/2024.      This case underwent intradepartmental consultation and Dr. Belvie concurs with      the interpretation.   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  While her tumor burden was solitary, stalked and fully resected, it was sized at approximately 3 cm.  Prudently, this would classify as high risk disease (although clinically she may border towards intermediate).  I do think she would be a reasonable candidate for induction BCG versus gemcitabine .  We may certainly consider deferring or abbreviating maintenance therapy at risk of overtreatment or continued morbidity.  Alternatively, I did review the role of surveillance only-which would need to be quite vigilant as she is at high risk of recurrence and progression. I do not think she needs an additional TURBT at this time.   Discussed induction intravesical therapy for intermediate/high-risk NMIBC, including BCG (weekly 6) with review of expected local/systemic side effects. Reviewed outcomes data showing ~30-40% reduction in recurrence at 2-3 years and ~20-30% reduction in progression at ~5 years, with strongest benefit in high-risk disease. Induction gemcitabine  (weekly 6) discussed as an evidence-based alternative in the setting of BCG intolerance or unavailability, with meaningful early recurrence reduction and improved tolerability.   - induction BCG, repeat cystoscopy ~6-8 weeks following completion induction  Penne JONELLE Skye, MD  [facility] [address] [place], [place] [address] [phone] "

## 2024-03-21 DIAGNOSIS — C679 Malignant neoplasm of bladder, unspecified: Secondary | ICD-10-CM | POA: Insufficient documentation

## 2024-03-21 NOTE — Assessment & Plan Note (Addendum)
 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  While her tumor burden was solitary, stalked and fully resected, it was sized at approximately 3 cm.  Prudently, this would classify as high risk disease (although clinically she may border towards intermediate).  I do think she would be a reasonable candidate for induction BCG versus gemcitabine .  We may certainly consider deferring or abbreviating maintenance therapy at risk of overtreatment or continued morbidity.  Alternatively, I did review the role of surveillance only-which would need to be quite vigilant as she is at high risk of recurrence and progression. I do not think she needs an additional TURBT at this time.   Discussed induction intravesical therapy for intermediate/high-risk NMIBC, including BCG (weekly 6) with review of expected local/systemic side effects. Reviewed outcomes data showing ~30-40% reduction in recurrence at 2-3 years and ~20-30% reduction in progression at ~5 years, with strongest benefit in high-risk disease. Induction gemcitabine  (weekly 6) discussed as an evidence-based alternative in the setting of BCG intolerance or unavailability, with meaningful early recurrence reduction and improved tolerability.   - induction BCG, repeat cystoscopy ~6-8 weeks following completion induction

## 2024-03-22 ENCOUNTER — Ambulatory Visit: Admitting: Urology

## 2024-03-22 ENCOUNTER — Encounter: Payer: Self-pay | Admitting: Urology

## 2024-03-22 VITALS — BP 154/77 | HR 79 | Ht 62.0 in | Wt 105.8 lb

## 2024-03-22 DIAGNOSIS — C672 Malignant neoplasm of lateral wall of bladder: Secondary | ICD-10-CM

## 2024-03-22 NOTE — Patient Instructions (Signed)

## 2024-03-24 ENCOUNTER — Inpatient Hospital Stay: Admitting: Oncology

## 2024-03-24 ENCOUNTER — Inpatient Hospital Stay

## 2024-03-30 ENCOUNTER — Telehealth: Payer: Self-pay

## 2024-03-30 NOTE — Telephone Encounter (Signed)
 Auth Submission: NO AUTH NEEDED Site of care: Urology Payer: [employer] medicare Medication & CPT/J Code(s) submitted: BCG Diagnosis Code:  Route of submission (phone, fax, portal): portal Phone # Fax # Auth type: Buy/Bill PB Units/visits requested:  Reference number:  Approval from: 03/30/24 to 03/15/25

## 2024-04-05 NOTE — Progress Notes (Unsigned)
 BCG Bladder Instillation  BCG # 1/6  Due to Bladder Cancer patient is present today for a BCG treatment. Patient was cleaned and prepped in a sterile fashion with betadine. A 14FR catheter was inserted, urine return was noted 15 ml, urine was yellow in color.  50ml of reconstituted BCG was instilled into the bladder. The catheter was then removed. Patient tolerated well, no complications were noted  Performed by: Marry Sara, PA-C and Andrea Kirks, LPN  Follow up/ Additional notes: One week for #2/6 BCG

## 2024-04-06 ENCOUNTER — Ambulatory Visit

## 2024-04-06 VITALS — BP 145/73 | HR 88 | Wt 105.0 lb

## 2024-04-06 DIAGNOSIS — C672 Malignant neoplasm of lateral wall of bladder: Secondary | ICD-10-CM

## 2024-04-06 LAB — URINALYSIS, COMPLETE
Bilirubin, UA: NEGATIVE
Glucose, UA: NEGATIVE
Ketones, UA: NEGATIVE
Leukocytes,UA: NEGATIVE
Nitrite, UA: NEGATIVE
Protein,UA: NEGATIVE
RBC, UA: NEGATIVE
Specific Gravity, UA: 1.01 (ref 1.005–1.030)
Urobilinogen, Ur: 0.2 mg/dL (ref 0.2–1.0)
pH, UA: 6 (ref 5.0–7.5)

## 2024-04-06 LAB — MICROSCOPIC EXAMINATION

## 2024-04-06 MED ORDER — BCG LIVE 50 MG IS SUSR
3.2400 mL | Freq: Once | INTRAVESICAL | Status: AC
  Administered 2024-04-06: 81 mg via INTRAVESICAL

## 2024-04-06 NOTE — Progress Notes (Signed)
 Additional notes: Patient remained in clinic for 30 minutes following instillation today for monitoring of hypersensitivity reaction; none noted.  We reviewed post-instillation instructions including holding the urine for 2 hours with quarter turns every 15 minutes and pouring bleach into the toilet with subsequent voids for 6 hours. Written instructions also provided today. She expressed understanding.

## 2024-04-12 NOTE — Progress Notes (Unsigned)
 BCG Bladder Instillation  BCG # 2/6  Due to Bladder Cancer patient is present today for a BCG treatment. Patient was cleaned and prepped in a sterile fashion with betadine. A 14 FR catheter was inserted, urine return was noted 100 ml, urine was yellow in color.  50ml of reconstituted BCG was instilled into the bladder. The catheter was then removed. Patient tolerated well, no complications were noted  Performed by: CLOTILDA CORNWALL, PA-C and Trish LITTIE Pinal, RN and Florrie Gobble, NT  Follow up/ Additional notes: One week for # 3/6

## 2024-04-13 ENCOUNTER — Ambulatory Visit: Admitting: Urology

## 2024-04-13 VITALS — BP 138/79 | HR 98 | Ht 62.0 in | Wt 105.0 lb

## 2024-04-13 DIAGNOSIS — C672 Malignant neoplasm of lateral wall of bladder: Secondary | ICD-10-CM | POA: Diagnosis not present

## 2024-04-13 DIAGNOSIS — Z5111 Encounter for antineoplastic chemotherapy: Secondary | ICD-10-CM

## 2024-04-13 DIAGNOSIS — Z5112 Encounter for antineoplastic immunotherapy: Secondary | ICD-10-CM | POA: Diagnosis not present

## 2024-04-13 LAB — URINALYSIS, COMPLETE
Bilirubin, UA: NEGATIVE
Glucose, UA: NEGATIVE
Ketones, UA: NEGATIVE
Leukocytes,UA: NEGATIVE
Nitrite, UA: NEGATIVE
Protein,UA: NEGATIVE
RBC, UA: NEGATIVE
Specific Gravity, UA: <1.005 — ABNORMAL LOW (ref 1.005–1.030)
Urobilinogen, Ur: 0.2 mg/dL (ref 0.2–1.0)
pH, UA: 5.5 (ref 5.0–7.5)

## 2024-04-13 LAB — MICROSCOPIC EXAMINATION
Bacteria, UA: NONE SEEN
RBC, Urine: NONE SEEN /HPF (ref 0–2)

## 2024-04-13 MED ORDER — BCG LIVE 50 MG IS SUSR
3.2400 mL | Freq: Once | INTRAVESICAL | Status: AC
  Administered 2024-04-13: 81 mg via INTRAVESICAL

## 2024-04-19 ENCOUNTER — Inpatient Hospital Stay

## 2024-04-19 NOTE — Progress Notes (Unsigned)
 BCG Bladder Instillation  BCG # 3/6  Due to Bladder Cancer patient is present today for a BCG treatment. Patient was cleaned and prepped in a sterile fashion with betadine. A 14 FR catheter was inserted, urine return was noted 280 ml, urine was yellow in color.  50ml of reconstituted BCG was instilled into the bladder. The catheter was then removed. Patient tolerated well, no complications were noted  Performed by: CLOTILDA CORNWALL, PA-C and Andrea DELENA Kirks, LPN   Follow up/ Additional notes: One week for #4/6 BCG

## 2024-04-20 ENCOUNTER — Ambulatory Visit: Admitting: Urology

## 2024-04-20 VITALS — BP 153/85 | HR 86 | Wt 105.0 lb

## 2024-04-20 DIAGNOSIS — C672 Malignant neoplasm of lateral wall of bladder: Secondary | ICD-10-CM

## 2024-04-20 LAB — URINALYSIS, COMPLETE
Bilirubin, UA: NEGATIVE
Glucose, UA: NEGATIVE
Ketones, UA: NEGATIVE
Leukocytes,UA: NEGATIVE
Nitrite, UA: NEGATIVE
Protein,UA: NEGATIVE
RBC, UA: NEGATIVE
Specific Gravity, UA: 1.01 (ref 1.005–1.030)
Urobilinogen, Ur: 0.2 mg/dL (ref 0.2–1.0)
pH, UA: 6 (ref 5.0–7.5)

## 2024-04-20 LAB — MICROSCOPIC EXAMINATION: Bacteria, UA: NONE SEEN

## 2024-04-20 MED ORDER — BCG LIVE 50 MG IS SUSR
3.2400 mL | Freq: Once | INTRAVESICAL | Status: AC
  Administered 2024-04-20: 81 mg via INTRAVESICAL

## 2024-04-27 ENCOUNTER — Ambulatory Visit: Admitting: Urology

## 2024-05-04 ENCOUNTER — Ambulatory Visit: Admitting: Urology

## 2024-05-11 ENCOUNTER — Ambulatory Visit: Admitting: Urology

## 2024-05-17 ENCOUNTER — Other Ambulatory Visit: Admitting: Urology

## 2024-07-13 ENCOUNTER — Ambulatory Visit: Admitting: Family Medicine
